# Patient Record
Sex: Male | Born: 1985 | Race: Black or African American | Hispanic: No | Marital: Single | Smoking: Current every day smoker
Health system: Southern US, Community
[De-identification: ages and names within clinical notes are randomized; demographics above are authoritative.]

## PROBLEM LIST (undated history)

## (undated) DIAGNOSIS — J45909 Unspecified asthma, uncomplicated: Secondary | ICD-10-CM

## (undated) DIAGNOSIS — J40 Bronchitis, not specified as acute or chronic: Secondary | ICD-10-CM

---

## 2010-07-20 ENCOUNTER — Emergency Department (HOSPITAL_COMMUNITY): Admission: EM | Admit: 2010-07-20 | Discharge: 2010-07-20 | Payer: Self-pay | Admitting: Emergency Medicine

## 2010-10-31 ENCOUNTER — Emergency Department (HOSPITAL_COMMUNITY)
Admission: EM | Admit: 2010-10-31 | Discharge: 2010-10-31 | Payer: Self-pay | Source: Home / Self Care | Admitting: Emergency Medicine

## 2011-01-10 ENCOUNTER — Emergency Department (HOSPITAL_COMMUNITY)
Admission: EM | Admit: 2011-01-10 | Discharge: 2011-01-10 | Disposition: A | Discharge status: Home / Self Care | Payer: Self-pay | Attending: Emergency Medicine | Admitting: Emergency Medicine

## 2011-01-10 DIAGNOSIS — R51 Headache: Secondary | ICD-10-CM | POA: Insufficient documentation

## 2012-02-05 ENCOUNTER — Emergency Department (HOSPITAL_COMMUNITY): Payer: Self-pay

## 2012-02-05 ENCOUNTER — Encounter (HOSPITAL_COMMUNITY): Payer: Self-pay

## 2012-02-05 ENCOUNTER — Emergency Department (HOSPITAL_COMMUNITY)
Admission: EM | Admit: 2012-02-05 | Discharge: 2012-02-05 | Disposition: A | Discharge status: Home / Self Care | Payer: Self-pay | Attending: Emergency Medicine | Admitting: Emergency Medicine

## 2012-02-05 DIAGNOSIS — F329 Major depressive disorder, single episode, unspecified: Secondary | ICD-10-CM

## 2012-02-05 DIAGNOSIS — F419 Anxiety disorder, unspecified: Secondary | ICD-10-CM

## 2012-02-05 DIAGNOSIS — F341 Dysthymic disorder: Secondary | ICD-10-CM | POA: Insufficient documentation

## 2012-02-05 DIAGNOSIS — R079 Chest pain, unspecified: Secondary | ICD-10-CM | POA: Insufficient documentation

## 2012-02-05 DIAGNOSIS — R0602 Shortness of breath: Secondary | ICD-10-CM | POA: Insufficient documentation

## 2012-02-05 LAB — RAPID URINE DRUG SCREEN, HOSP PERFORMED
Barbiturates: NOT DETECTED
Benzodiazepines: NOT DETECTED

## 2012-02-05 LAB — CBC
HCT: 44.4 % (ref 39.0–52.0)
Hemoglobin: 15.1 g/dL (ref 13.0–17.0)
MCV: 89.3 fL (ref 78.0–100.0)
RDW: 11.7 % (ref 11.5–15.5)
WBC: 6.8 10*3/uL (ref 4.0–10.5)

## 2012-02-05 LAB — BASIC METABOLIC PANEL
BUN: 13 mg/dL (ref 6–23)
CO2: 25 mEq/L (ref 19–32)
Chloride: 104 mEq/L (ref 96–112)
Creatinine, Ser: 0.94 mg/dL (ref 0.50–1.35)
GFR calc Af Amer: >90 mL/min (ref >90)
Glucose, Bld: 100 mg/dL — ABNORMAL HIGH (ref 70–99)
Potassium: 3.7 mEq/L (ref 3.5–5.1)

## 2012-02-05 MED ORDER — LORAZEPAM 1 MG PO TABS
2.0000 mg | ORAL_TABLET | Freq: Once | ORAL | Status: AC
  Administered 2012-02-05: 2 mg via ORAL
  Filled 2012-02-05: qty 2

## 2012-02-05 MED ORDER — ONDANSETRON HCL 4 MG PO TABS: 4.0000 mg | ORAL_TABLET | Freq: Three times a day (TID) | ORAL | Status: DC | PRN

## 2012-02-05 MED ORDER — ASPIRIN 81 MG PO CHEW
324.0000 mg | CHEWABLE_TABLET | Freq: Once | ORAL | Status: AC
  Administered 2012-02-05: 324 mg via ORAL
  Filled 2012-02-05: qty 4

## 2012-02-05 MED ORDER — IBUPROFEN 600 MG PO TABS: 600.0000 mg | ORAL_TABLET | Freq: Three times a day (TID) | ORAL | Status: DC | PRN

## 2012-02-05 MED ORDER — ALUM & MAG HYDROXIDE-SIMETH 200-200-20 MG/5ML PO SUSP: 30.0000 mL | ORAL | Status: DC | PRN

## 2012-02-05 MED ORDER — NICOTINE 21 MG/24HR TD PT24: 21.0000 mg | MEDICATED_PATCH | Freq: Every day | TRANSDERMAL | Status: DC

## 2012-02-05 MED ORDER — ZOLPIDEM TARTRATE 5 MG PO TABS: 5.0000 mg | ORAL_TABLET | Freq: Every evening | ORAL | Status: DC | PRN

## 2012-02-05 MED ORDER — ACETAMINOPHEN 325 MG PO TABS: 650.0000 mg | ORAL_TABLET | ORAL | Status: DC | PRN

## 2012-02-05 NOTE — ED Notes (Signed)
Pt c/o chest pain 8, explained to him due to anxiety and to follow up with [facility] asap to be prescribed medications for anxiety.

## 2012-02-05 NOTE — ED Notes (Signed)
Spoke to MD Jeraldine Loots, pt will not be given additional medicine prior to discharging home as he is medically stable. Pt told again to follow up with [facility] to get anxiety and depression medications prescribed.

## 2012-02-05 NOTE — Discharge Instructions (Signed)
Anxiety and Panic Attacks   Your caregiver has informed you that you are having an anxiety or panic attack. There may be many forms of this. Most of the time these attacks come suddenly and without warning. They come at any time of day, including periods of sleep, and at any time of life. They may be strong and unexplained. Although panic attacks are very scary, they are physically harmless. Sometimes the cause of your anxiety is not known. Anxiety is a protective mechanism of the body in its fight or flight mechanism. Most of these perceived danger situations are actually nonphysical situations (such as anxiety over losing a job).   CAUSES   The causes of an anxiety or panic attack are many. Panic attacks may occur in otherwise healthy people given a certain set of circumstances. There may be a genetic cause for panic attacks. Some medications may also have anxiety as a side effect.   SYMPTOMS   Some of the most common feelings are:   Intense terror.   Dizziness, feeling faint.   Hot and cold flashes.   Fear of going crazy.   Feelings that nothing is real.   Sweating.   Shaking.   Chest pain or a fast heartbeat (palpitations).   Smothering, choking sensations.   Feelings of impending doom and that death is near.   Tingling of extremities, this may be from over-breathing.   Altered reality (derealization).   Being detached from yourself (depersonalization).   Several symptoms can be present to make up anxiety or panic attacks.   DIAGNOSIS   The evaluation by your caregiver will depend on the type of symptoms you are experiencing. The diagnosis of anxiety or panic attack is made when no physical illness can be determined to be a cause of the symptoms.   TREATMENT   Treatment to prevent anxiety and panic attacks may include:   Avoidance of circumstances that cause anxiety.   Reassurance and relaxation.   Regular exercise.   Relaxation therapies, such as yoga.   Psychotherapy with a psychiatrist or therapist.    Avoidance of caffeine, alcohol and illegal drugs.   Prescribed medication.   SEEK IMMEDIATE MEDICAL CARE IF:   You experience panic attack symptoms that are different than your usual symptoms.   You have any worsening or concerning symptoms.   Document Released: 09/21/2005 Document Revised: 09/10/2011 Document Reviewed: 01/23/2010   ExitCare® Patient Information ©2012 [employer].   Depression, Adolescent and Adult   Depression is a true and treatable medical condition. In general there are two kinds of depression:   Depression we all experience in some form. For example depression from the death of a loved one, financial distress or natural disasters will trigger or increase depression.   Clinical depression, on the other hand, appears without an apparent cause or reason. This depression is a disease. Depression may be caused by chemical imbalance in the body and brain or may come as a response to a physical illness. Alcohol and other drugs can cause depression.   DIAGNOSIS   The diagnosis of depression is usually based upon symptoms and medical history.   TREATMENT   Treatments for depression fall into three categories. These are:   Drug therapy. There are many medicines that treat depression. Responses may vary and sometimes trial and error is necessary to determine the best medicines and dosage for a particular patient.   Psychotherapy, also called talking treatments, helps people resolve their problems by looking at them from   move toward solving those. If the cause of depression is drug use, counseling is available to help abstain. In time the depression will usually improve. If there were underlying causes for the chemical use, they can be addressed.   ECT  (electroconvulsive therapy) or shock treatment is not as commonly used today. It is a very effective treatment for severe suicidal depression. During ECT electrical impulses are applied to the head. These impulses cause a generalized seizure. It can be effective but causes a loss of memory for recent events. Sometimes this loss of memory may include the last several months.  Treat all depression or suicide threats as serious. Obtain professional help. Do not wait to see if serious depression will get better over time without help. Seek help for yourself or those around you. In the [place] the number to the National Suicide Help Lines With 24 Hour Help Are: 1-800-SUICIDE (780) 613-7142 Document Released: 09/18/2000 Document Revised: 09/10/2011 Document Reviewed: 05/09/2008 [facility] Patient Information 2012 [place], [place].Emotional Crisis Part of your problem today may be due to an emotional crisis. Emotional states can cause many different physical signs and symptoms. These may include:  Chest or stomach pain.   Fluttering heartbeat.   Passing out.   Breathing difficulty.   Headaches.   Trembling.   Hot or cold flashes.   Numbness.   Dizziness.   Unusual muscle pain or fatigue.   Insomnia.  When you have other medical problems, they are often made worse by emotional upsets. Emotional crises can increase your stress and anxiety. Finding ways to reduce your stress level can make you feel better. You will become more capable of dealing with these emotional states. Regular physical exercise such as walking can be very beneficial. Counseling or medicine to treat anxiety or depression may also be needed. See your caregiver if you have further problems or questions about your condition. Document Released: 09/21/2005 Document Revised: 09/10/2011 Document Reviewed: 03/08/2007 [facility] Patient Information 2012 [place], [place].Suicidal Feelings, How to Help Yourself Everyone feels sad or unhappy  at times, but depressing thoughts and feelings of hopelessness can lead to thoughts of suicide. It can seem as if life is too tough to handle. It is as if the mountain is just too high and your climbing skills are not great enough. At that moment these dark thoughts and feelings may seem overwhelming and never ending. It is important to remember these feelings are temporary! They will go away. If you feel as though you have reached the point where suicide is the only answer, it is time to let someone know immediately. This is the first step to feeling better. The following steps will move you to safer ground and lead you in a positive direction out of depression. HOW TO COPE AND PREVENT SUICIDE  Let family, friends, teachers and/or counselors know. Get help. Try not to isolate yourself from those who care about you. Even though you may not feel sociable or think that you are not good company, talk with someone everyday. It is best if it is face to face. Remember, they will want to help you.   Eat a regularly spaced and well-balanced diet, and get plenty of rest.   Avoid alcohol and drugs because they will only make you feel worse and may also lower your inhibitions. Remove them from the home. If you are thinking of taking an overdose of your prescribed medications, give your medicines to someone who can give them to you one day at a time. If  you are on antidepressants, let your caregiver know of your feelings so he or she can provide a safer medication, if that is a concern.   Remove weapons or poisons from your home.   Try to stick to routines. That may mean just walking the dog or feeding the cat. Follow a schedule and remind yourself that you have to keep that schedule every day. Play with your pets. If it is possible, and you do not have a pet, get one. They give you a sense of well-being, lower your blood pressure and make your heart feel good. They need you, and we all want to be needed.   Set  some realistic goals and achieve them. Make a list and cross things off as you go. Accomplishments give a sense of worth. Wait until you are feeling better before doing things you find difficult or unpleasant to do.   If you are able, try to start exercising. Even half-hour periods of exercise each day will make you feel better. Getting out in the sun or into nature helps you recover from depression faster. If you have a favorite place to walk, take advantage of that.   Increase safe activities that have always given you pleasure. This may include playing your favorite music, reading a good book, painting a picture or playing your favorite instrument. Do whatever takes your mind off your depression and puts a smile on your face.   Keep your living space well lit with windows open, and let the sun shine in. Bright light definitely treats depression, not just people with the seasonal affective disorders (SAD).  Above all else remember, depression is temporary. It will go away. Do not contemplate suicide. Death as a permanent solution is not the answer. Suicide will take away the beautiful rest of life, and do lifelong harm to those around you who love you. Help is available. National Suicide Help Lines with 24 hour help are: 1-800-SUICIDE 805-763-3653 Document Released: 03/28/2003 Document Revised: 09/10/2011 Document Reviewed: 08/16/2007 [facility] Patient Information 2012 [place], [place].  RESOURCE GUIDE  Dental Problems  Patients with Medicaid: [facility] [phone] [address]                                           [phone] [address] Phone:  [phone]                                                  Phone:  [phone]  If unable to pay or uninsured, contact:  [facility] or [facility]. to become qualified for the adult dental clinic.  Chronic Pain Problems Contact [facility]  [phone] Patients need  to be referred by their primary care doctor.  Insufficient Money for Medicine Contact United Way:  call "211" or [facility] [phone].  No Primary Care Doctor Call [facility]  [phone] Other agencies that provide inexpensive medical care    [facility]  [phone]    [facility] Internal Medicine  [phone]    [facility]  [phone]  Women's Clinic  [phone]    [facility]  [phone]    [facility]  [phone]  Psychological Services [facility]  [phone] [facility]  [phone] [facility] Mental [facility]   [phone] (emergency services [phone])  Substance Abuse Resources [facility]  [phone] [facility] [phone] [facility] [phone] Floydene Flock [phone] Residential & Outpatient Substance Abuse Program  9062801011  Abuse/Neglect [facility] Child Abuse Hotline 780-348-9632 [facility] Child Abuse Hotline [phone] (After Hours)  Emergency Shelter [facility] [phone]  [facility] [phone] [facility] [phone]  MRSA Hotline #:   417-100-6821    [facility] Resources  [facility] [place]     [employer]                          [facility] Dept. [address] [place]                       [address]      [address]  Blondell Reveal Phone:  [phone]                                   Phone:  [phone]                 Phone:  [phone]  [facility] Mental Health Phone:  [phone]  [facility] Child Abuse Hotline 434-562-1550 [phone] (After Hours)

## 2012-02-05 NOTE — BH Assessment (Signed)
Assessment Note   Eric Stout is an 26 y.o. male. Pt presented to [facility] with chest pain and shortness of breath. Upon discussion, pt disclosed stress and anxiety related to issues going on in his life. Pt stated he has some vague SI at times, with none currently. Reports having a mother very sick from HIV, relationship that ended suddenly, which he is grieving over and some work issues. Pt denies prior history of SI attempts or gestures. Pt denies HI or psychosis. Pt reports having dreams that his daughter is there, but when he awakens she is gone. Pt has increased anxiety/depressive symptoms as follows: poor appetite, decrease in sleep, tearful episodes and recently started having panic attacks. First panic attack was two weeks ago with the second occurring today. Pt stated to ACT "I'm not crazy. I don't need to be here (in psych ED). I just need someone to talk to." Discussed possible EAP and OPT with pt. Denies prior OPT history and only one IPT over 15 years ago for SA. Pt is agreeable with OPT referrals. Discussed with EDP. Pt was provided with OPT referrals to [facility] OPT and other community OPT resources.  Axis I: Anxiety Disorder NOS Axis II: Deferred Axis III: History reviewed. No pertinent past medical history. Axis IV: other psychosocial or environmental problems, problems related to social environment and problems with primary support group Axis V: 41-50 serious symptoms  Past Medical History: History reviewed. No pertinent past medical history.  History reviewed. No pertinent past surgical history.  Family History: History reviewed. No pertinent family history.  Social History:  reports that he has been smoking.  He does not have any smokeless tobacco history on file. He reports that he drinks alcohol. He reports that he uses illicit drugs (Marijuana).  Additional Social History:  Alcohol / Drug Use Pain Medications: N/A Prescriptions: N/A Over the Counter: N/A History of alcohol /  drug use?: Yes Substance #1 Name of Substance 1: THC 1 - Age of First Use: teens 1 - Amount (size/oz): varies 1 - Frequency: varies 1 - Duration: off/on years 1 - Last Use / Amount: Unknown Allergies:  Allergies  Allergen Reactions  . Penicillins Other (See Comments)    Doesn't remember.    Home Medications:  (Not in a hospital admission)  OB/GYN Status:  No LMP for male patient.  General Assessment Data Location of Assessment: [facility] ED Living Arrangements: Other relatives Can pt return to current living arrangement?: Yes Admission Status: Voluntary Is patient capable of signing voluntary admission?: Yes Transfer from: Acute Hospital Referral Source: Self/Family/Friend  Education Status Is patient currently in school?: No  Risk to self Suicidal Ideation: No-Not Currently/Within Last 6 Months (Admits to having SI thoughts sometimes) Suicidal Intent: No Is patient at risk for suicide?: No Suicidal Plan?: No Access to Means: No What has been your use of drugs/alcohol within the last 12 months?: Occasional THC Previous Attempts/Gestures: No How many times?: 0  Other Self Harm Risks: N/A Triggers for Past Attempts: None known Intentional Self Injurious Behavior: None Family Suicide History: Yes (MH on mother's side) Recent stressful life event(s): Conflict (Comment);Other (Comment) (Mother is sick w/ HIV; Relationship breakup; work issues) Persecutory voices/beliefs?: No Depression: Yes Depression Symptoms: Despondent;Insomnia;Tearfulness Substance abuse history and/or treatment for substance abuse?: Yes Suicide prevention information given to non-admitted patients: Yes  Risk to Others Homicidal Ideation: No Thoughts of Harm to Others: No Current Homicidal Intent: No Current Homicidal Plan: No Access to Homicidal Means: No Identified Victim: n/a History  of harm to others?: No Assessment of Violence: None Noted Violent Behavior Description: calm, cooperative,  tearful Does patient have access to weapons?: No Criminal Charges Pending?: No Does patient have a court date: No  Psychosis Hallucinations: None noted Delusions: None noted  Mental Status Report Appear/Hygiene: Other (Comment) (unremarkable) Eye Contact: Good Motor Activity: Freedom of movement Speech: Logical/coherent Level of Consciousness: Alert;Crying Mood: Anxious;Sad Affect: Appropriate to circumstance;Anxious Anxiety Level: Panic Attacks Panic attack frequency: had first 2 wks ago; then again today Most recent panic attack: 02/05/12 Thought Processes: Coherent;Relevant Judgement: Unimpaired Orientation: Person;Place;Time;Situation Obsessive Compulsive Thoughts/Behaviors: None  Cognitive Functioning Concentration: Decreased Memory: Recent Intact;Remote Intact IQ: Average Insight: Good Impulse Control: Good Appetite: Poor Weight Loss: 0  Weight Gain: 0  Sleep: Decreased Total Hours of Sleep: 3  Vegetative Symptoms: None  Prior Inpatient Therapy Prior Inpatient Therapy: Yes Prior Therapy Dates: 15+ yrs ago Prior Therapy Facilty/Provider(s): Unknown Reason for Treatment: SA  Prior Outpatient Therapy Prior Outpatient Therapy: No  ADL Screening (condition at time of admission) Patient's cognitive ability adequate to safely complete daily activities?: Yes Patient able to express need for assistance with ADLs?: Yes Independently performs ADLs?: Yes Weakness of Legs: None Weakness of Arms/Hands: None  Home Assistive Devices/Equipment Home Assistive Devices/Equipment: None    Abuse/Neglect Assessment (Assessment to be complete while patient is alone) Physical Abuse: Denies Verbal Abuse: Denies Sexual Abuse: Denies Exploitation of patient/patient's resources: Denies Self-Neglect: Denies     Merchant navy officer (For Healthcare) Advance Directive: Patient does not have advance directive;Patient would not like information Pre-existing out of facility DNR  order (yellow form or pink MOST form): No    Additional Information 1:1 In Past 12 Months?: No CIRT Risk: No Elopement Risk: No Does patient have medical clearance?: Yes     Disposition:  Disposition Disposition of Patient: Outpatient treatment;Referred to [facility] OPT; OPT referrals) Type of outpatient treatment: Adult Patient referred to: Other (Comment) [facility] OPT; OPT referrals)  On Site Evaluation by:   Reviewed with Physician:     Romeo Apple 02/05/2012 3:40 PM

## 2012-02-05 NOTE — ED Notes (Signed)
Pt walking w/securtiy to Psych ED.  Security carrying 2 labeled belongings bags to be locked in lockers in psych ed area.

## 2012-02-05 NOTE — ED Notes (Signed)
Pt has fallen asleep.  Appears to be relaxed.  When he awakes, startes to hyperventilate and c/o the chest "heart" pain.

## 2012-02-05 NOTE — ED Provider Notes (Signed)
History     CSN: [id number]  Arrival date & time 02/05/12  1116   First MD Initiated Contact with Patient 02/05/12 1128      Chief Complaint  Patient presents with  . Chest Pain  . Shortness of Breath  . Medical Clearance    (Consider location/radiation/quality/duration/timing/severity/associated sxs/prior treatment) HPI  Pt presents to the ED with concerns of chest pressure, shortness of breath, anxiety, stress, hopelessness. He states that his mom is very sick with HIV and that he has a lot of things going on at home, with his relationships with work. The patient is moaning and very tearful during examination. Clutching his chest and saying that he hurts all over and that he needs to talk to someone to get help because of his depression. The patient does not have a history of cardiac disease, hypertension, or hypercholesterolemia. He does admit to the use of cocaine in the past but not within the last month. He does not have a family history of cardiac dz. He has not tried anything at home for the pain. He has not had any vomiting, diarrhea, or abdominal pain. No past psychiatric history.  History reviewed. No pertinent past medical history.  History reviewed. No pertinent past surgical history.  History reviewed. No pertinent family history.  History  Substance Use Topics  . Smoking status: Current Everyday Smoker -- 1.0 packs/day  . Smokeless tobacco: Not on file  . Alcohol Use: Yes     once in a while.      Review of Systems   HEENT: denies blurry vision or change in hearing PULMONARY: Denies difficulty breathing and SOB CARDIAC: denies chest pain or heart palpitations MUSCULOSKELETAL:  denies being unable to ambulate ABDOMEN AL: denies abdominal pain GU: denies loss of bowel or urinary control NEURO: denies numbness and tingling in extremities   Allergies  Penicillins  Home Medications  No current outpatient prescriptions on file.  BP 126/73  Pulse 68   Temp(Src) 98.2 F (36.8 C) (Oral)  Resp 18  SpO2 98%  Physical Exam  Nursing note and vitals reviewed. Constitutional: He appears well-developed and well-nourished. No distress.  HENT:  Head: Normocephalic and atraumatic.  Eyes: Pupils are equal, round, and reactive to light.  Neck: Normal range of motion. Neck supple.  Cardiovascular: Normal rate and regular rhythm.   Pulmonary/Chest: Breath sounds normal. Accessory muscle usage: on initial examination. Tachypnea noted.  Abdominal: Soft.  Neurological: He is alert.  Skin: Skin is warm and dry.  Psychiatric: His mood appears anxious. He exhibits a depressed mood.    ED Course  Procedures (including critical care time)  Labs Reviewed  URINE RAPID DRUG SCREEN (HOSP PERFORMED) - Abnormal; Notable for the following:    Tetrahydrocannabinol POSITIVE (*)    All other components within normal limits  BASIC METABOLIC PANEL - Abnormal; Notable for the following:    Glucose, Bld 100 (*)    All other components within normal limits  CBC  TROPONIN I  ETHANOL   Dg Chest 2 View  02/05/2012  *RADIOLOGY REPORT*  Clinical Data: Chest pain.  Anxiety.  CHEST - 2 VIEW  Comparison: None  Findings: Heart and mediastinal contours are within normal limits. No focal opacities or effusions.  No acute bony abnormality.  IMPRESSION: No active cardiopulmonary disease.  Original Report Authenticated By: Cyndie Chime, M.D.     1. Anxiety   2. Depression       MDM  Pt given 2mg  PO ativan. Since  receiving the medication he has been chest pain and SOB free. His vital signs are stable, no longer tachypnic. He is no longer very anxious and is now resting comfortably. He admits to wanting to talk to someone for his anxiety and continued feelings of hopelessness.  Will consult ACT.  Pt evaluated by ACT and given resources for help with his life stressors. Pt is cleared to go home at this time.   Pt has been advised of the symptoms that warrant their  return to the ED. Patient has voiced understanding and has agreed to follow-up with the PCP or specialist.       Dorthula Matas, PA 02/05/12 1530

## 2012-02-05 NOTE — ED Notes (Signed)
Pt c/o pain to chest starting last night-stabbing and aching.  Also shob.  Pt is hyperventilating at present.  Reassurance given.  On heart monitor showing NSR.   Pt has now fallen asleep and is breathing normally.  When previously asked, pt denied any drug use.

## 2012-02-05 NOTE — ED Notes (Signed)
Pt is discharging home with referrals.

## 2012-02-06 NOTE — ED Provider Notes (Signed)
Medical screening examination/treatment/procedure(s) were performed by non-physician practitioner and as supervising physician I was immediately available for consultation/collaboration.  Kaedin Hicklin, MD 02/06/12 1530 

## 2014-10-15 ENCOUNTER — Encounter (HOSPITAL_COMMUNITY): Payer: Self-pay | Admitting: Emergency Medicine

## 2014-10-15 ENCOUNTER — Emergency Department (HOSPITAL_COMMUNITY)
Admission: EM | Admit: 2014-10-15 | Discharge: 2014-10-15 | Disposition: A | Discharge status: Home / Self Care | Payer: Self-pay | Attending: Emergency Medicine | Admitting: Emergency Medicine

## 2014-10-15 DIAGNOSIS — M546 Pain in thoracic spine: Secondary | ICD-10-CM | POA: Insufficient documentation

## 2014-10-15 DIAGNOSIS — Z720 Tobacco use: Secondary | ICD-10-CM | POA: Insufficient documentation

## 2014-10-15 DIAGNOSIS — Z880 Allergy status to penicillin: Secondary | ICD-10-CM | POA: Insufficient documentation

## 2014-10-15 DIAGNOSIS — J45909 Unspecified asthma, uncomplicated: Secondary | ICD-10-CM | POA: Insufficient documentation

## 2014-10-15 HISTORY — DX: Unspecified asthma, uncomplicated: J45.909

## 2014-10-15 MED ORDER — METHOCARBAMOL 500 MG PO TABS: 500.0000 mg | ORAL_TABLET | Freq: Two times a day (BID) | ORAL | Status: DC

## 2014-10-15 MED ORDER — NAPROXEN 500 MG PO TABS: 500.0000 mg | ORAL_TABLET | Freq: Two times a day (BID) | ORAL | Status: DC

## 2014-10-15 MED ORDER — METHOCARBAMOL 500 MG PO TABS
500.0000 mg | ORAL_TABLET | Freq: Once | ORAL | Status: AC
  Administered 2014-10-15: 500 mg via ORAL
  Filled 2014-10-15: qty 1

## 2014-10-15 MED ORDER — TRAMADOL HCL 50 MG PO TABS
50.0000 mg | ORAL_TABLET | Freq: Once | ORAL | Status: AC
  Administered 2014-10-15: 50 mg via ORAL
  Filled 2014-10-15: qty 1

## 2014-10-15 MED ORDER — TRAMADOL HCL 50 MG PO TABS: 50.0000 mg | ORAL_TABLET | Freq: Four times a day (QID) | ORAL | Status: DC | PRN
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
## 2015-02-26 ENCOUNTER — Emergency Department (HOSPITAL_COMMUNITY)
Admission: EM | Admit: 2015-02-26 | Discharge: 2015-02-27 | Disposition: A | Discharge status: Home / Self Care | Payer: Self-pay | Attending: Emergency Medicine | Admitting: Emergency Medicine

## 2015-02-26 ENCOUNTER — Emergency Department (HOSPITAL_COMMUNITY): Payer: Self-pay

## 2015-02-26 ENCOUNTER — Encounter (HOSPITAL_COMMUNITY): Payer: Self-pay | Admitting: Emergency Medicine

## 2015-02-26 DIAGNOSIS — Z880 Allergy status to penicillin: Secondary | ICD-10-CM | POA: Insufficient documentation

## 2015-02-26 DIAGNOSIS — Z791 Long term (current) use of non-steroidal anti-inflammatories (NSAID): Secondary | ICD-10-CM | POA: Insufficient documentation

## 2015-02-26 DIAGNOSIS — Z720 Tobacco use: Secondary | ICD-10-CM | POA: Insufficient documentation

## 2015-02-26 DIAGNOSIS — Z79899 Other long term (current) drug therapy: Secondary | ICD-10-CM | POA: Insufficient documentation

## 2015-02-26 DIAGNOSIS — J45901 Unspecified asthma with (acute) exacerbation: Secondary | ICD-10-CM | POA: Insufficient documentation

## 2015-02-26 DIAGNOSIS — J40 Bronchitis, not specified as acute or chronic: Secondary | ICD-10-CM

## 2015-02-26 NOTE — ED Notes (Addendum)
Patient c/o burning to chest with cough, as well as sinus congestion. Cough x 2 days, light yellow phlegm. Patient states his neck is also sore. Patient has been increasing his fluid intake to help with his symptoms. Fever/chills.

## 2015-02-27 MED ORDER — PHENYLEPHRINE-DM-GG-APAP 5-10-200-325 MG PO TABS: 2.0000 | ORAL_TABLET | Freq: Three times a day (TID) | ORAL | Status: AC

## 2015-02-27 MED ORDER — NAPROXEN 500 MG PO TABS: 500.0000 mg | ORAL_TABLET | Freq: Two times a day (BID) | ORAL | Status: DC

## 2015-02-27 MED ORDER — ALBUTEROL SULFATE HFA 108 (90 BASE) MCG/ACT IN AERS
2.0000 | INHALATION_SPRAY | RESPIRATORY_TRACT | Status: AC
  Administered 2015-02-27: 2 via RESPIRATORY_TRACT
  Filled 2015-02-27: qty 6.7

## 2015-02-27 MED ORDER — ALBUTEROL SULFATE (2.5 MG/3ML) 0.083% IN NEBU
5.0000 mg | INHALATION_SOLUTION | Freq: Once | RESPIRATORY_TRACT | Status: AC
  Administered 2015-02-27: 5 mg via RESPIRATORY_TRACT
  Filled 2015-02-27: qty 6

## 2015-02-27 MED ORDER — NAPROXEN 500 MG PO TABS
500.0000 mg | ORAL_TABLET | Freq: Once | ORAL | Status: AC
  Administered 2015-02-27: 500 mg via ORAL
  Filled 2015-02-27: qty 1

## 2015-02-27 MED ORDER — IPRATROPIUM BROMIDE 0.02 % IN SOLN
0.5000 mg | Freq: Once | RESPIRATORY_TRACT | Status: AC
  Administered 2015-02-27: 0.5 mg via RESPIRATORY_TRACT
  Filled 2015-02-27: qty 2.5

## 2015-02-27 NOTE — Discharge Instructions (Signed)
Please follow the directions provided. Be sure to use the resource guide or the referral given to establish care with a primary care doctor to make sure you're getting better. Please use the inhaler 2 puffs every 4 hours to help with cough and shortness of breath. Please take naproxen twice a day to help with pain and inflammation. Please take the multisymptom cold medicine 3 times a day for other cold symptoms. Be sure to drink plenty of water to stay well hydrated. Don't hesitate to return for any new, worsening, or concerning symptoms.   SEEK IMMEDIATE MEDICAL CARE IF:  You develop an increased fever or chills.  You have chest pain.  You have severe shortness of breath.  You have bloody sputum.  You develop dehydration.  You faint or repeatedly feel like you are going to pass out.  You develop repeated vomiting.  You develop a severe headache.

## 2015-02-27 NOTE — ED Provider Notes (Signed)
CSN: [id number]     Arrival date & time 02/26/15  2240 History   First MD Initiated Contact with Patient 02/27/15 0101     Chief Complaint  Patient presents with  . Cough   (Consider location/radiation/quality/duration/timing/severity/associated sxs/prior Treatment) HPI  Eric Stout is a 29 year old male presenting with cough and nasal congestion. He states he symptoms began 2 days ago" progressively worsened. He states when he coughs it burns in his chest, he also coughs up yellow mucus. He reports muscle aches and subjective fever and chills. He initially reports pain in his neck however during the interview this clarified to mean his bilateral shoulder muscles and he denies any neck stiffness. He currently rates his discomfort as an 8 out of 10. He denies any hemoptysis, vomiting or diarrhea or chest pain without coughing.  Past Medical History  Diagnosis Date  . Asthma    History reviewed. No pertinent past surgical history. History reviewed. No pertinent family history. History  Substance Use Topics  . Smoking status: Current Every Day Smoker -- 0.25 packs/day  . Smokeless tobacco: Not on file  . Alcohol Use: No     Comment: once in a while.    Review of Systems  Constitutional: Negative for fever and chills.  HENT: Positive for congestion. Negative for sore throat.   Eyes: Negative for visual disturbance.  Respiratory: Positive for cough and chest tightness. Negative for shortness of breath.   Gastrointestinal: Negative for nausea, vomiting and diarrhea.  Genitourinary: Negative for dysuria.  Musculoskeletal: Negative for myalgias.  Skin: Negative for rash.  Neurological: Negative for weakness, numbness and headaches.      Allergies  Peanuts and Penicillins  Home Medications   Prior to Admission medications   Medication Sig Start Date End Date Taking? Authorizing Provider  methocarbamol (ROBAXIN) 500 MG tablet Take 1 tablet (500 mg total) by mouth 2 (two) times  daily. 10/15/14   Heather Laisure, PA-C  naproxen (NAPROSYN) 500 MG tablet Take 1 tablet (500 mg total) by mouth 2 (two) times daily. 10/15/14   Heather Laisure, PA-C  traMADol (ULTRAM) 50 MG tablet Take 1 tablet (50 mg total) by mouth every 6 (six) hours as needed. 10/15/14   Heather Laisure, PA-C   BP 131/75 mmHg  Pulse 105  Temp(Src) 98.3 F (36.8 C) (Oral)  Resp 18  Ht  (1.778 m)  Wt 170 lb (77.111 kg)  BMI 24.39 kg/m2  SpO2 97% Physical Exam  Constitutional: He appears well-developed and well-nourished. No distress.  HENT:  Head: Normocephalic and atraumatic.  Mouth/Throat: Oropharynx is clear and moist.  Eyes: Conjunctivae are normal.  Neck: Neck supple.  Cardiovascular: Normal rate, regular rhythm and intact distal pulses.   Pulmonary/Chest: Effort normal. No respiratory distress. He has wheezes in the right middle field and the left middle field. He has no rhonchi. He has no rales. He exhibits no tenderness.  Abdominal: Soft. There is no tenderness.  Lymphadenopathy:    He has no cervical adenopathy.  Neurological: He is alert.  Skin: Skin is warm and dry. He is not diaphoretic.  Psychiatric: He has a normal mood and affect.  Nursing note and vitals reviewed.   ED Course  Procedures (including critical care time) Labs Review Labs Reviewed - No data to display  Imaging Review Dg Chest 2 View  02/26/2015   CLINICAL DATA:  Cough and chest congestion for 1 week  EXAM: CHEST  2 VIEW  COMPARISON:  02/05/2012  FINDINGS: Normal heart size and  mediastinal contours. No acute infiltrate or edema. No effusion or pneumothorax. No acute osseous findings.  IMPRESSION: Negative chest.   Electronically Signed   By: Marnee SpringJonathon  Watts M.D.   On: 02/26/2015 23:12     EKG Interpretation None      MDM   Final diagnoses:  Bronchitis   29 yo with symptoms consistent with bronchitis. CXR negative. Symptoms improved after neb tx. Pt will be discharged with symptomatic treatment. Pt  is well-appearing, in no acute distress and vital signs reviewed and not concerning. He appears safe to be discharged. Discharge include follow-up with their PCP. Return precautions provided. Pt aware of plan and in agreement.     Filed Vitals:   02/26/15 2247 02/27/15 0220  BP: 131/75 123/75  Pulse: 105 99  Temp: 98.3 F (36.8 C)   TempSrc: Oral   Resp: 18 14  Height: 5\' 10"  (1.778 m)   Weight: 170 lb (77.111 kg)   SpO2: 97% 100%   Meds given in ED:  Medications  albuterol (PROVENTIL) (2.5 MG/3ML) 0.083% nebulizer solution 5 mg (5 mg Nebulization Given 02/27/15 0219)  ipratropium (ATROVENT) nebulizer solution 0.5 mg (0.5 mg Nebulization Given 02/27/15 0220)  naproxen (NAPROSYN) tablet 500 mg (500 mg Oral Given 02/27/15 0220)  albuterol (PROVENTIL HFA;VENTOLIN HFA) 108 (90 BASE) MCG/ACT inhaler 2 puff (2 puffs Inhalation Given 02/27/15 0219)    Discharge Medication List as of 02/27/2015  1:55 AM    START taking these medications   Details  !! naproxen (NAPROSYN) 500 MG tablet Take 1 tablet (500 mg total) by mouth 2 (two) times daily., Starting 02/27/2015, Until Discontinued, Print    Phenylephrine-DM-GG-APAP 5-10-200-325 MG TABS Take 2 tablets by mouth 3 (three) times daily., Starting 02/27/2015, Until Discontinued, Print     !! - Potential duplicate medications found. Please discuss with provider.        Harle BattiestElizabeth Sonny Anthes, NP 03/01/15 [phone]  Tomasita CrumbleAdeleke Oni, MD 03/01/15 [phone]

## 2015-05-23 ENCOUNTER — Encounter (HOSPITAL_COMMUNITY): Payer: Self-pay | Admitting: Cardiology

## 2015-05-23 ENCOUNTER — Emergency Department (HOSPITAL_COMMUNITY)
Admission: EM | Admit: 2015-05-23 | Discharge: 2015-05-23 | Disposition: A | Discharge status: Home / Self Care | Payer: Self-pay | Attending: Emergency Medicine | Admitting: Emergency Medicine

## 2015-05-23 ENCOUNTER — Emergency Department (HOSPITAL_COMMUNITY): Payer: Self-pay

## 2015-05-23 DIAGNOSIS — R0789 Other chest pain: Secondary | ICD-10-CM | POA: Insufficient documentation

## 2015-05-23 DIAGNOSIS — Z880 Allergy status to penicillin: Secondary | ICD-10-CM | POA: Insufficient documentation

## 2015-05-23 DIAGNOSIS — Z79899 Other long term (current) drug therapy: Secondary | ICD-10-CM | POA: Insufficient documentation

## 2015-05-23 DIAGNOSIS — Z791 Long term (current) use of non-steroidal anti-inflammatories (NSAID): Secondary | ICD-10-CM | POA: Insufficient documentation

## 2015-05-23 DIAGNOSIS — Z720 Tobacco use: Secondary | ICD-10-CM | POA: Insufficient documentation

## 2015-05-23 DIAGNOSIS — J45909 Unspecified asthma, uncomplicated: Secondary | ICD-10-CM | POA: Insufficient documentation

## 2015-05-23 LAB — BASIC METABOLIC PANEL
ANION GAP: 7 (ref 5–15)
BUN: 8 mg/dL (ref 6–20)
CO2: 27 mmol/L (ref 22–32)
Calcium: 9.5 mg/dL (ref 8.9–10.3)
Chloride: 106 mmol/L (ref 101–111)
Creatinine, Ser: 1.14 mg/dL (ref 0.61–1.24)
GFR calc Af Amer: >60 mL/min (ref >60)
Glucose, Bld: 88 mg/dL (ref 65–99)
POTASSIUM: 4.1 mmol/L (ref 3.5–5.1)
SODIUM: 140 mmol/L (ref 135–145)

## 2015-05-23 LAB — CBC
HEMATOCRIT: 47.6 % (ref 39.0–52.0)
HEMOGLOBIN: 16.3 g/dL (ref 13.0–17.0)
MCH: 31 pg (ref 26.0–34.0)
MCHC: 34.2 g/dL (ref 30.0–36.0)
MCV: 90.7 fL (ref 78.0–100.0)
Platelets: 235 10*3/uL (ref 150–400)
RBC: 5.25 MIL/uL (ref 4.22–5.81)
RDW: 11.8 % (ref 11.5–15.5)
WBC: 9.6 10*3/uL (ref 4.0–10.5)

## 2015-05-23 LAB — I-STAT TROPONIN, ED: Troponin i, poc: 0 ng/mL (ref 0.00–0.08)

## 2015-05-23 MED ORDER — ALBUTEROL SULFATE (2.5 MG/3ML) 0.083% IN NEBU
5.0000 mg | INHALATION_SOLUTION | Freq: Once | RESPIRATORY_TRACT | Status: AC
  Administered 2015-05-23: 5 mg via RESPIRATORY_TRACT
  Filled 2015-05-23: qty 6

## 2015-05-23 MED ORDER — PREDNISONE 20 MG PO TABS
60.0000 mg | ORAL_TABLET | Freq: Once | ORAL | Status: AC
  Administered 2015-05-23: 60 mg via ORAL
  Filled 2015-05-23: qty 3

## 2015-05-23 MED ORDER — PREDNISONE 20 MG PO TABS: 40.0000 mg | ORAL_TABLET | Freq: Every day | ORAL | Status: DC

## 2015-05-23 NOTE — ED Notes (Signed)
Pt reports that he started having chest pain this morning that hurts with deep breaths. States he has not had nausea or vomiting. Also reports some back pain also.

## 2015-05-23 NOTE — Discharge Instructions (Signed)
Take the prescribed medication as directed.  Use your inhaler as needed for chest tightness/shortness of breath/wheezing. [facility]up with [facility]-- call to make appt. Return to the ED for new or worsening symptoms.

## 2015-05-23 NOTE — ED Provider Notes (Signed)
CSN: [id number]     Arrival date & time 05/23/15  4098 History   First MD Initiated Contact with Patient 05/23/15 1112     Chief Complaint  Patient presents with  . Chest Pain     (Consider location/radiation/quality/duration/timing/severity/associated sxs/prior Treatment) Patient is a 29 y.o. male presenting with chest pain. The history is provided by the patient and medical records.  Chest Pain   This is a 29 year old male with history of asthma, presenting to the ED for chest pain. Patient states it began this morning around 0500.  Pain is described as sharp in the middle of his chest. No radiation of pain. He denies shortness of breath, states he does have some pain with deep inspiration. He denies any diaphoresis, nausea, vomiting, fever, cough, or chills. He denies any recent travel, lower extremity edema, calf pain, recent surgery, or trauma. No history of DVT or PE.  VSS.  Past Medical History  Diagnosis Date  . Asthma    History reviewed. No pertinent past surgical history. History reviewed. No pertinent family history. Social History  Substance Use Topics  . Smoking status: Current Every Day Smoker -- 0.25 packs/day  . Smokeless tobacco: None  . Alcohol Use: No     Comment: once in a while.    Review of Systems  Cardiovascular: Positive for chest pain.  All other systems reviewed and are negative.     Allergies  Peanuts and Penicillins  Home Medications   Prior to Admission medications   Medication Sig Start Date End Date Taking? Authorizing Provider  methocarbamol (ROBAXIN) 500 MG tablet Take 1 tablet (500 mg total) by mouth 2 (two) times daily. 10/15/14   Heather Laisure, PA-C  naproxen (NAPROSYN) 500 MG tablet Take 1 tablet (500 mg total) by mouth 2 (two) times daily. 10/15/14   Heather Laisure, PA-C  naproxen (NAPROSYN) 500 MG tablet Take 1 tablet (500 mg total) by mouth 2 (two) times daily. 02/27/15   Harle Battiest, NP  Phenylephrine-DM-GG-APAP  5-10-200-325 MG TABS Take 2 tablets by mouth 3 (three) times daily. 02/27/15   Harle Battiest, NP  traMADol (ULTRAM) 50 MG tablet Take 1 tablet (50 mg total) by mouth every 6 (six) hours as needed. 10/15/14   Heather Laisure, PA-C   BP 135/80 mmHg  Pulse 65  Temp(Src) 98.3 F (36.8 C) (Oral)  Resp 16  Ht  (1.803 m)  Wt 170 lb (77.111 kg)  BMI 23.72 kg/m2  SpO2 99%   Physical Exam  Constitutional: He is oriented to person, place, and time. He appears well-developed and well-nourished. No distress.  NAD, laughing in room with wife  HENT:  Head: Normocephalic and atraumatic.  Mouth/Throat: Oropharynx is clear and moist.  Eyes: Conjunctivae and EOM are normal. Pupils are equal, round, and reactive to light.  Neck: Normal range of motion. Neck supple.  Cardiovascular: Normal rate, regular rhythm and normal heart sounds.   Pulmonary/Chest: Effort normal. No respiratory distress. He has decreased breath sounds. He has no wheezes. He has no rhonchi.  Decreased air movement bilaterally, no wheezes or rhonchi, no distress, speaking in full complete sentences without difficulty  Abdominal: Soft. Bowel sounds are normal. There is no tenderness. There is no guarding.  Musculoskeletal: Normal range of motion.  Neurological: He is alert and oriented to person, place, and time.  Skin: Skin is warm and dry. He is not diaphoretic.  Psychiatric: He has a normal mood and affect.  Nursing note and vitals reviewed.   ED  Course  Procedures (including critical care time) Labs Review Labs Reviewed  BASIC METABOLIC PANEL  CBC  I-STAT TROPOININ, ED    Imaging Review Dg Chest 2 View  05/23/2015   CLINICAL DATA:  Posterior chest pain and pain with inspiration for 3 hr. No known injury. Initial encounter.  EXAM: CHEST  2 VIEW  COMPARISON:  PA and lateral chest 02/26/2015 and 02/05/2012.  FINDINGS: Heart size and mediastinal contours are within normal limits. Both lungs are clear. Visualized  skeletal structures are unremarkable.  IMPRESSION: Normal exam.   Electronically Signed   By: Drusilla Kanner M.D.   On: 05/23/2015 10:23   I have personally reviewed and evaluated these images and lab results as part of my medical decision-making.   EKG Interpretation None      MDM   Final diagnoses:  Chest tightness   29 year old male here with chest tightness and pain with deep inspiration. He does have history of asthma but has not used his inhaler in several months. Triage note reports back pain-- no mention of this to me during any point of interview/exam.  Patient is afebrile, nontoxic. He is in no acute distress. He has had decreased breath sounds bilaterally without wheezes or rhonchi. Vital signs stable on room air. EKG sinus rhythm without acute ischemic changes. Lab work including troponin are within normal limits. Chest x-ray is clear. Patient will be given dose of prednisone and albuterol nebulizer treatment. Will reassess.  After medications, patient states he is feeling better. He has increased air movement throughout. His vital signs remained stable on room air. At this time, lower suspicion for ACS, PE, dissection, or other acute cardiac event at this time. Patient is PERC negative.  Symptoms more likely due to his asthma.  Rx prednisone taper for home.  [facility].  Discussed plan with patient, he/she acknowledged understanding and agreed with plan of care.  Return precautions given for new or worsening symptoms.   Garlon Hatchet, PA-C 05/23/15 1434  Bethann Berkshire, MD 05/24/15 [phone]

## 2016-08-14 ENCOUNTER — Emergency Department (HOSPITAL_COMMUNITY): Payer: Self-pay

## 2016-08-14 ENCOUNTER — Emergency Department (HOSPITAL_COMMUNITY)
Admission: EM | Admit: 2016-08-14 | Discharge: 2016-08-14 | Disposition: A | Discharge status: Home / Self Care | Payer: Self-pay | Attending: Emergency Medicine | Admitting: Emergency Medicine

## 2016-08-14 ENCOUNTER — Encounter (HOSPITAL_COMMUNITY): Payer: Self-pay | Admitting: Nurse Practitioner

## 2016-08-14 DIAGNOSIS — J45901 Unspecified asthma with (acute) exacerbation: Secondary | ICD-10-CM | POA: Insufficient documentation

## 2016-08-14 DIAGNOSIS — J069 Acute upper respiratory infection, unspecified: Secondary | ICD-10-CM

## 2016-08-14 DIAGNOSIS — F17200 Nicotine dependence, unspecified, uncomplicated: Secondary | ICD-10-CM | POA: Insufficient documentation

## 2016-08-14 DIAGNOSIS — Z91010 Allergy to peanuts: Secondary | ICD-10-CM | POA: Insufficient documentation

## 2016-08-14 HISTORY — DX: Bronchitis, not specified as acute or chronic: J40

## 2016-08-14 MED ORDER — CETIRIZINE-PSEUDOEPHEDRINE ER 5-120 MG PO TB12: 1.0000 | ORAL_TABLET | Freq: Two times a day (BID) | ORAL | 0 refills | Status: DC

## 2016-08-14 MED ORDER — ALBUTEROL SULFATE HFA 108 (90 BASE) MCG/ACT IN AERS
1.0000 | INHALATION_SPRAY | Freq: Once | RESPIRATORY_TRACT | Status: AC
  Administered 2016-08-14: 2 via RESPIRATORY_TRACT
  Filled 2016-08-14: qty 6.7

## 2016-08-14 MED ORDER — BENZONATATE 100 MG PO CAPS: 100.0000 mg | ORAL_CAPSULE | Freq: Three times a day (TID) | ORAL | 0 refills | Status: DC

## 2016-08-14 MED ORDER — PREDNISONE 20 MG PO TABS: 60.0000 mg | ORAL_TABLET | Freq: Every day | ORAL | 0 refills | Status: DC

## 2016-08-14 NOTE — ED Triage Notes (Signed)
Pt presents with c/o cough. The cough began about 2 weeks ago and has become progressively worse since onset. He reports rhinnorea, yellow sputum with cough, back pain. He denies fevers, sore throat, n/v. He has been unable to sleep well because of the cough. He tried ginger tea with no relief. He is alert and breathing easily

## 2016-08-14 NOTE — Discharge Instructions (Signed)
Medications: Albuterol inhaler, prednisone, Zyrtec-D, Tessalon  Treatment: Use albuterol inhaler every 4-6 hours as needed for shortness of breath, wheezing, cough. Take prednisone as prescribed for 5 days. Use Zyrtec-D twice daily as prescribed for nasal congestion and related allergy symptoms. Take Tessalon every 8 hours as needed for cough.  Follow-up: Please return to the emergency department if you develop any new or worsening symptoms including worsening shortness of breath, chest pain, fever, or any other concerning symptoms.

## 2016-08-14 NOTE — ED Provider Notes (Signed)
[facility]Y DEPT Provider Note   CSN: [id number] Arrival date & time: 08/14/16  1328  By signing my name below, I, Placido Sou, attest that this documentation has been prepared under the direction and in the presence of [employer], PA-C.  Electronically Signed: Placido Sou, ED Scribe. 08/14/16. 2:13 PM.    History   Chief Complaint Chief Complaint  Patient presents with  . Cough    HPI HPI Comments: Eric Stout is a 30 y.o. male with a h/o asthma, bronchitis and smoking who presents to the Emergency Department complaining of worsening, moderate, non-productive cough x 2 weeks. Pt reports associated SOB and wheezing at night, mild CP only when coughing, post nasal drip, rhinorrhea, and congestion. Pt denies having an inhaler at home. He denies fevers, chills, abdominal pain, nausea, vomiting, sore throat and ear pain.    The history is provided by the patient. No language interpreter was used.    Past Medical History:  Diagnosis Date  . Asthma   . Bronchitis     There are no active problems to display for this patient.   History reviewed. No pertinent surgical history.   Home Medications    Prior to Admission medications   Medication Sig Start Date End Date Taking? Authorizing Provider  benzonatate (TESSALON) 100 MG capsule Take 1 capsule (100 mg total) by mouth every 8 (eight) hours. 08/14/16   Lariyah Shetterly M Canuto Kingston, PA-C  cetirizine-pseudoephedrine (ZYRTEC-D) 5-120 MG tablet Take 1 tablet by mouth 2 (two) times daily. 08/14/16   Emi Holes, PA-C  methocarbamol (ROBAXIN) 500 MG tablet Take 1 tablet (500 mg total) by mouth 2 (two) times daily. Patient not taking: Reported on 05/23/2015 10/15/14   Santiago Glad, PA-C  naproxen (NAPROSYN) 500 MG tablet Take 1 tablet (500 mg total) by mouth 2 (two) times daily. Patient not taking: Reported on 05/23/2015 10/15/14   Santiago Glad, PA-C  naproxen (NAPROSYN) 500 MG tablet Take 1 tablet (500 mg total) by mouth 2  (two) times daily. Patient not taking: Reported on 05/23/2015 02/27/15   Harle Battiest, NP  Phenylephrine-DM-GG-APAP 5-10-200-325 MG TABS Take 2 tablets by mouth 3 (three) times daily. Patient not taking: Reported on 05/23/2015 02/27/15   Harle Battiest, NP  predniSONE (DELTASONE) 20 MG tablet Take 3 tablets (60 mg total) by mouth daily. 08/14/16   Emi Holes, PA-C  traMADol (ULTRAM) 50 MG tablet Take 1 tablet (50 mg total) by mouth every 6 (six) hours as needed. Patient not taking: Reported on 05/23/2015 10/15/14   Santiago Glad, PA-C    Family History History reviewed. No pertinent family history.  Social History Social History  Substance Use Topics  . Smoking status: Current Every Day Smoker    Packs/day: 0.25  . Smokeless tobacco: Never Used  . Alcohol use No     Comment: once in a while.     Allergies   Peanuts [peanut oil] and Penicillins   Review of Systems Review of Systems  Constitutional: Negative for chills and fever.  HENT: Positive for congestion, postnasal drip and rhinorrhea. Negative for ear discharge, ear pain and sore throat.   Respiratory: Positive for cough, shortness of breath and wheezing.   Cardiovascular: Positive for chest pain.  Gastrointestinal: Negative for abdominal pain, nausea and vomiting.   Physical Exam Updated Vital Signs BP 133/75 (BP Location: Right Arm)   Pulse 80   Temp 98.2 F (36.8 C) (Oral)   Resp 16   Ht 5\' 11"  (1.803 m)   Wt  81.6 kg   SpO2 99%   BMI 25.10 kg/m   Physical Exam  Constitutional: He appears well-developed and well-nourished. No distress.  HENT:  Head: Normocephalic and atraumatic.  Right Ear: Tympanic membrane normal.  Left Ear: Tympanic membrane normal.  Mouth/Throat: Oropharynx is clear and moist. No oropharyngeal exudate.  Eyes: Conjunctivae are normal. Pupils are equal, round, and reactive to light. Right eye exhibits no discharge. Left eye exhibits no discharge. No scleral icterus.  Neck:  Normal range of motion. Neck supple. No thyromegaly present.  Cardiovascular: Normal rate, regular rhythm, normal heart sounds and intact distal pulses.  Exam reveals no gallop and no friction rub.   No murmur heard. Pulmonary/Chest: Effort normal and breath sounds normal. No stridor. No respiratory distress. He has no wheezes. He has no rales.  Abdominal: Soft. Bowel sounds are normal. He exhibits no distension. There is no tenderness. There is no rebound and no guarding.  Musculoskeletal: He exhibits no edema.  Lymphadenopathy:    He has no cervical adenopathy.  Neurological: He is alert. Coordination normal.  Skin: Skin is warm and dry. No rash noted. He is not diaphoretic. No pallor.  Psychiatric: He has a normal mood and affect.  Nursing note and vitals reviewed.  ED Treatments / Results  Labs (all labs ordered are listed, but only abnormal results are displayed) Labs Reviewed - No data to display  EKG  EKG Interpretation None       Radiology Dg Chest 2 View  Result Date: 08/14/2016 CLINICAL DATA:  30 year old male with a history of cough EXAM: CHEST  2 VIEW COMPARISON:  05/23/2015 FINDINGS: The heart size and mediastinal contours are within normal limits. Both lungs are clear. The visualized skeletal structures are unremarkable. IMPRESSION: No acute cardiopulmonary disease. Signed, Yvone NeuJaime S. Loreta AveWagner, DO [facility] Electronically Signed   By: Gilmer MorJaime  Wagner D.O.   On: 08/14/2016 14:36    Procedures Procedures  DIAGNOSTIC STUDIES: Oxygen Saturation is 99% on RA, normal by my interpretation.    COORDINATION OF CARE: 2:13 PM Discussed next steps with pt. Pt verbalized understanding and is agreeable with the plan.    Medications Ordered in ED Medications  albuterol (PROVENTIL HFA;VENTOLIN HFA) 108 (90 Base) MCG/ACT inhaler 1-2 puff (2 puffs Inhalation Given 08/14/16 1439)     Initial Impression / Assessment and  Plan / ED Course  I have reviewed the triage vital signs and the nursing notes.  Pertinent labs & imaging results that were available during my care of the patient were reviewed by me and considered in my medical decision making (see chart for details).  Clinical Course     Pt symptoms consistent with URI, possible bronchitis. CXR negative for acute infiltrate. Patient was discharged with albuterol inhaler, 5 day burst of prednisone, Zyrtec-D, and Tessalon for cough. Oxygen saturations at 99% and above. Discussed return precautions.  Patient vitals stable throughout ED course and discharged in satisfactory condition.  I personally performed the services described in this documentation, which was scribed in my presence. The recorded information has been reviewed and is accurate.  Final Clinical Impressions(s) / ED Diagnoses   Final diagnoses:  Exacerbation of asthma, unspecified asthma severity, unspecified whether persistent  Upper respiratory tract infection, unspecified type    New Prescriptions Discharge Medication List as of 08/14/2016  2:45 PM    START taking these medications   Details  benzonatate (TESSALON) 100 MG capsule Take 1 capsule (100 mg total) by mouth every  8 (eight) hours., Starting Fri 08/14/2016, Print    cetirizine-pseudoephedrine (ZYRTEC-D) 5-120 MG tablet Take 1 tablet by mouth 2 (two) times daily., Starting Fri 08/14/2016, Print         Emi Holeslexandra M Hartlee Amedee, PA-C 08/14/16 1600    Laurence Spatesachel Morgan Little, MD 08/15/16 [phone]

## 2016-08-14 NOTE — ED Notes (Signed)
Pt returned from X-ray.  

## 2016-08-14 NOTE — ED Notes (Signed)
Patient transported to X-ray 

## 2016-08-19 ENCOUNTER — Encounter (HOSPITAL_COMMUNITY): Payer: Self-pay | Admitting: *Deleted

## 2016-08-19 ENCOUNTER — Emergency Department (HOSPITAL_COMMUNITY)
Admission: EM | Admit: 2016-08-19 | Discharge: 2016-08-19 | Disposition: A | Discharge status: Home / Self Care | Payer: Self-pay | Attending: Emergency Medicine | Admitting: Emergency Medicine

## 2016-08-19 DIAGNOSIS — J040 Acute laryngitis: Secondary | ICD-10-CM | POA: Insufficient documentation

## 2016-08-19 DIAGNOSIS — F17200 Nicotine dependence, unspecified, uncomplicated: Secondary | ICD-10-CM | POA: Insufficient documentation

## 2016-08-19 DIAGNOSIS — J45909 Unspecified asthma, uncomplicated: Secondary | ICD-10-CM | POA: Insufficient documentation

## 2016-08-19 DIAGNOSIS — Z79899 Other long term (current) drug therapy: Secondary | ICD-10-CM | POA: Insufficient documentation

## 2016-08-19 DIAGNOSIS — B9789 Other viral agents as the cause of diseases classified elsewhere: Secondary | ICD-10-CM

## 2016-08-19 DIAGNOSIS — Z91010 Allergy to peanuts: Secondary | ICD-10-CM | POA: Insufficient documentation

## 2016-08-19 DIAGNOSIS — J069 Acute upper respiratory infection, unspecified: Secondary | ICD-10-CM

## 2016-08-19 MED ORDER — PREDNISONE 20 MG PO TABS: 40.0000 mg | ORAL_TABLET | Freq: Every day | ORAL | 0 refills | Status: DC

## 2016-08-19 MED ORDER — DEXTROMETHORPHAN-GUAIFENESIN 15-400 MG PO TABS: 1.0000 | ORAL_TABLET | ORAL | 0 refills | Status: DC | PRN

## 2016-08-19 NOTE — Discharge Instructions (Signed)
You appear to have an upper respiratory infection (URI). An upper respiratory tract infection, or cold, is a viral infection of the air passages leading to the lungs. It is contagious and can be spread to others, especially during the first 3 or 4 days. It cannot be cured by antibiotics or other medicines. °RETURN IMMEDIATELY IF you develop shortness of breath, confusion or altered mental status, a new rash, become dizzy, faint, or poorly responsive, or are unable to be cared for at home. ° °

## 2016-08-19 NOTE — ED Provider Notes (Signed)
[facility] Provider Note   CSN: [id number] Arrival date & time: 08/19/16  1301  By signing my name below, I, Freida Busmaniana Omoyeni, attest that this documentation has been prepared under the direction and in the presence of non-physician practitioner, Arthor CaptainAbigail Samir Ishaq, PA-C. Electronically Signed: Freida Busmaniana Omoyeni, Scribe. 08/19/2016. 2:38 PM.   History   Chief Complaint Chief Complaint  Patient presents with  . URI     The history is provided by the patient. No language interpreter was used.     HPI Comments:  Eric Stout is a 30 y.o. male with a history of asthma, who presents to the Emergency Department complaining of a productive cough x 2 weeks. He reports associated hoarse voice, mild sore throat, and mild wheezing.  He has been using his inhaler with minimal relief. Pt was seen here for the same and discharged with Tessalon and Zyrtec but states he has not yet filled those prescriptions He denies fever,  He is a current smoker   Past Medical History:  Diagnosis Date  . Asthma   . Bronchitis     There are no active problems to display for this patient.   History reviewed. No pertinent surgical history.     Home Medications    Prior to Admission medications   Medication Sig Start Date End Date Taking? Authorizing Provider  Dextromethorphan-Guaifenesin 15-400 MG TABS Take 1 tablet by mouth every 4 (four) hours as needed. 08/19/16   Arthor CaptainAbigail Jesilyn Easom, PA-C  methocarbamol (ROBAXIN) 500 MG tablet Take 1 tablet (500 mg total) by mouth 2 (two) times daily. Patient not taking: Reported on 05/23/2015 10/15/14   Santiago GladHeather Laisure, PA-C  naproxen (NAPROSYN) 500 MG tablet Take 1 tablet (500 mg total) by mouth 2 (two) times daily. Patient not taking: Reported on 05/23/2015 10/15/14   Santiago GladHeather Laisure, PA-C  naproxen (NAPROSYN) 500 MG tablet Take 1 tablet (500 mg total) by mouth 2 (two) times daily. Patient not taking: Reported on 05/23/2015 02/27/15   Harle BattiestElizabeth Tysinger, NP    Phenylephrine-DM-GG-APAP 5-10-200-325 MG TABS Take 2 tablets by mouth 3 (three) times daily. Patient not taking: Reported on 05/23/2015 02/27/15   Harle BattiestElizabeth Tysinger, NP  predniSONE (DELTASONE) 20 MG tablet Take 2 tablets (40 mg total) by mouth daily. 08/19/16   Arthor CaptainAbigail Charee Tumblin, PA-C  traMADol (ULTRAM) 50 MG tablet Take 1 tablet (50 mg total) by mouth every 6 (six) hours as needed. Patient not taking: Reported on 05/23/2015 10/15/14   Santiago GladHeather Laisure, PA-C    Family History History reviewed. No pertinent family history.  Social History Social History  Substance Use Topics  . Smoking status: Current Every Day Smoker    Packs/day: 0.25  . Smokeless tobacco: Never Used  . Alcohol use No     Comment: once in a while.     Allergies   Peanuts [peanut oil] and Penicillins   Review of Systems Review of Systems  Constitutional: Negative for fever.  HENT: Positive for sore throat and voice change.   Respiratory: Positive for cough and wheezing.      Physical Exam Updated Vital Signs BP 143/75   Pulse 72   Temp 98.3 F (36.8 C) (Oral)   Resp 18   SpO2 98%   Physical Exam  Constitutional: He is oriented to person, place, and time. He appears well-developed and well-nourished. No distress.  HENT:  Head: Normocephalic.  Eyes: Conjunctivae are normal.  Neck: Normal range of motion. Neck supple.  Cardiovascular: Normal rate.   Pulmonary/Chest: Effort normal. He has  no wheezes.  Hoarse voice  Dry cough   Abdominal: He exhibits no distension.  Musculoskeletal: Normal range of motion.  Neurological: He is alert and oriented to person, place, and time.  Skin: Skin is warm and dry.  Psychiatric: He has a normal mood and affect.  Nursing note and vitals reviewed.    ED Treatments / Results  DIAGNOSTIC STUDIES:  Oxygen Saturation is 98% on RA, normal by my interpretation.    COORDINATION OF CARE:  2:25 PM Discussed treatment plan with pt at bedside and pt agreed to  plan.  Labs (all labs ordered are listed, but only abnormal results are displayed) Labs Reviewed - No data to display  EKG  EKG Interpretation None       Radiology No results found.  Procedures Procedures (including critical care time)  Medications Ordered in ED Medications - No data to display   Initial Impression / Assessment and Plan / ED Course  I have reviewed the triage vital signs and the nursing notes.  Pertinent labs & imaging results that were available during my care of the patient were reviewed by me and considered in my medical decision making (see chart for details).  Clinical Course       Pt symptoms consistent with URI.  Oxygen saturation is above 90%. No accessory muscle use, no cyanosis. Treated in the ED. Will discharge with Prednisone and cough meds. Pt instructed to follow up with PCP. Patient is hemodynamically stable. Discussed return precautions. Pt appears safe for discharge.     Final Clinical Impressions(s) / ED Diagnoses   Final diagnoses:  Viral URI with cough  Laryngitis    New Prescriptions New Prescriptions   DEXTROMETHORPHAN-GUAIFENESIN 15-400 MG TABS    Take 1 tablet by mouth every 4 (four) hours as needed.   PREDNISONE (DELTASONE) 20 MG TABLET    Take 2 tablets (40 mg total) by mouth daily.   I personally performed the services described in this documentation, which was scribed in my presence. The recorded information has been reviewed and is accurate.       Arthor Captainbigail Morningstar Toft, PA-C 08/19/16 1610    Lyndal Pulleyaniel Knott, MD 08/19/16 [phone]

## 2016-08-19 NOTE — ED Triage Notes (Signed)
Pt was here on 11/10 for cough and URI, was given prescriptions for zyrtec and tessalon but pt unable to fill the prescriptions and unable to work due to cough.

## 2016-08-19 NOTE — ED Triage Notes (Signed)
PT was seen 08-14-16 for asthma . Py has HHN but this is not working. Pt reports she was unable to fill other meds.Zyrtec and tessalon

## 2016-08-19 NOTE — ED Notes (Signed)
Declined W/C at D/C and was escorted to lobby by RN. 

## 2016-09-09 ENCOUNTER — Emergency Department (HOSPITAL_COMMUNITY)
Admission: EM | Admit: 2016-09-09 | Discharge: 2016-09-09 | Disposition: A | Discharge status: Home / Self Care | Payer: Self-pay | Attending: Emergency Medicine | Admitting: Emergency Medicine

## 2016-09-09 ENCOUNTER — Encounter (HOSPITAL_COMMUNITY): Payer: Self-pay

## 2016-09-09 DIAGNOSIS — J45909 Unspecified asthma, uncomplicated: Secondary | ICD-10-CM | POA: Insufficient documentation

## 2016-09-09 DIAGNOSIS — F17200 Nicotine dependence, unspecified, uncomplicated: Secondary | ICD-10-CM | POA: Insufficient documentation

## 2016-09-09 DIAGNOSIS — Z91010 Allergy to peanuts: Secondary | ICD-10-CM | POA: Insufficient documentation

## 2016-09-09 DIAGNOSIS — J069 Acute upper respiratory infection, unspecified: Secondary | ICD-10-CM | POA: Insufficient documentation

## 2016-09-09 NOTE — ED Triage Notes (Signed)
Patient complains of stomach virus on 12/2. Now here with ongoing dry cough and nausea. Reports that he had 1 episode of vomiting today. Denies abdominal pain

## 2016-09-09 NOTE — ED Provider Notes (Signed)
[facility] Provider Note   CSN: [id number] Arrival date & time: 09/09/16  [id number]     History   Chief Complaint Chief Complaint  Patient presents with  . cough, nausea    HPI Eric PleasureJames Kibby is a 30 y.o. male.  The history is provided by the patient and medical records. No language interpreter was used.   Eric Stout is a 30 y.o. male  with a PMH of asthma who presents to the Emergency Department complaining of improving cough and nasal congestion for the last week. Patient also states that he had the stomach virus on December 2, causing him to miss work. He states that he had nausea, vomiting, diarrhea for a day or 2 which then resolved. Daughter at bedside had similar symptoms. Patient reports that today he was very worried because he found out that his mother was missing. He filed a police report and got very anxious, then had 1 episode of emesis. He has no abdominal pain or nausea at this time. No fevers, back pain, dysuria, chest pain, shortness of breath. He has had no further episodes of emesis and is currently without complaints.  Past Medical History:  Diagnosis Date  . Asthma   . Bronchitis     There are no active problems to display for this patient.   History reviewed. No pertinent surgical history.     Home Medications    Prior to Admission medications   Medication Sig Start Date End Date Taking? Authorizing Provider  Dextromethorphan-Guaifenesin 15-400 MG TABS Take 1 tablet by mouth every 4 (four) hours as needed. Patient not taking: Reported on 09/09/2016 08/19/16   Arthor CaptainAbigail Harris, PA-C  methocarbamol (ROBAXIN) 500 MG tablet Take 1 tablet (500 mg total) by mouth 2 (two) times daily. Patient not taking: Reported on 09/09/2016 10/15/14   Santiago GladHeather Laisure, PA-C  naproxen (NAPROSYN) 500 MG tablet Take 1 tablet (500 mg total) by mouth 2 (two) times daily. Patient not taking: Reported on 09/09/2016 10/15/14   Santiago GladHeather Laisure, PA-C  naproxen (NAPROSYN) 500 MG  tablet Take 1 tablet (500 mg total) by mouth 2 (two) times daily. Patient not taking: Reported on 09/09/2016 02/27/15   Harle BattiestElizabeth Tysinger, NP  Phenylephrine-DM-GG-APAP 5-10-200-325 MG TABS Take 2 tablets by mouth 3 (three) times daily. Patient not taking: Reported on 09/09/2016 02/27/15   Harle BattiestElizabeth Tysinger, NP  predniSONE (DELTASONE) 20 MG tablet Take 2 tablets (40 mg total) by mouth daily. Patient not taking: Reported on 09/09/2016 08/19/16   Arthor CaptainAbigail Harris, PA-C  traMADol (ULTRAM) 50 MG tablet Take 1 tablet (50 mg total) by mouth every 6 (six) hours as needed. Patient not taking: Reported on 09/09/2016 10/15/14   Santiago GladHeather Laisure, PA-C    Family History No family history on file.  Social History Social History  Substance Use Topics  . Smoking status: Current Every Day Smoker    Packs/day: 0.25  . Smokeless tobacco: Never Used  . Alcohol use No     Comment: once in a while.     Allergies   Peanuts [peanut oil] and Penicillins   Review of Systems Review of Systems  Constitutional: Negative for chills and fever.  HENT: Positive for congestion.   Eyes: Negative for visual disturbance.  Respiratory: Positive for cough. Negative for shortness of breath and wheezing.   Cardiovascular: Negative.   Gastrointestinal: Positive for vomiting. Negative for abdominal pain and nausea.  Genitourinary: Negative for dysuria.  Musculoskeletal: Negative for back pain.  Skin: Negative for rash.  Neurological: Negative for  headaches.     Physical Exam Updated Vital Signs BP 117/69 (BP Location: Left Arm)   Pulse 95   Temp 98.3 F (36.8 C) (Oral)   Resp 18   SpO2 96%   Physical Exam  Constitutional: He is oriented to person, place, and time. He appears well-developed and well-nourished. No distress.  HENT:  Head: Normocephalic and atraumatic.  OP with erythema, no exudates or tonsillar hypertrophy. + nasal congestion with mucosal edema.   Neck: Normal range of motion. Neck supple.  No  meningeal signs.   Cardiovascular: Normal rate, regular rhythm and normal heart sounds.   Pulmonary/Chest: Effort normal.  Lungs are clear to auscultation bilaterally - no w/r/r  Abdominal: Soft. Bowel sounds are normal. He exhibits no distension. There is no tenderness.  Musculoskeletal: Normal range of motion.  Neurological: He is alert and oriented to person, place, and time.  Skin: Skin is warm and dry. He is not diaphoretic.  Nursing note and vitals reviewed.    ED Treatments / Results  Labs (all labs ordered are listed, but only abnormal results are displayed) Labs Reviewed - No data to display  EKG  EKG Interpretation None       Radiology No results found.  Procedures Procedures (including critical care time)  Medications Ordered in ED Medications - No data to display   Initial Impression / Assessment and Plan / ED Course  I have reviewed the triage vital signs and the nursing notes.  Pertinent labs & imaging results that were available during my care of the patient were reviewed by me and considered in my medical decision making (see chart for details).  Clinical Course    Eric PleasureJames Sigley is a 30 y.o. male who presents to ED for resolved n/v/d and improving uri symptoms. He had one episode of emesis today which occurred under a very stressful, anxious setting as his mother has gone missing. Police report was filed and during this process he had one episode of emesis. He has had no further episodes of emesis and state all abdominal symptoms have resolved. On exam, patient is afebrile hemodynamically stable and nontoxic appearing. He has clear lung sounds bilaterally and a benign abdominal exam. 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 Evaluation does not show pathology that would require ongoing emergent intervention or inpatient treatment. Patient is hemodynamically stable and mentating appropriately. Return precautions discussed and all questions  answered.    Final Clinical Impressions(s) / ED Diagnoses   Final diagnoses:  None    New Prescriptions New Prescriptions   No medications on file     [facility] CenterJaime Pilcher Toni Hoffmeister, PA-C 09/09/16 2152    Benjiman CoreNathan Pickering, MD 09/09/16 2342

## 2016-09-09 NOTE — ED Notes (Addendum)
Patient refused to have labs done at triage. Doesn't want test done.

## 2016-09-09 NOTE — Discharge Instructions (Signed)
Please follow up with your primary doctor for discussion of your diagnoses and further evaluation after today's visit if symptoms persist longer than 10 days; Return to the ER for high fevers, difficulty breathing or other concerning symptoms

## 2016-10-02 ENCOUNTER — Emergency Department (HOSPITAL_COMMUNITY): Payer: Self-pay

## 2016-10-02 ENCOUNTER — Encounter (HOSPITAL_COMMUNITY): Payer: Self-pay | Admitting: *Deleted

## 2016-10-02 ENCOUNTER — Emergency Department (HOSPITAL_COMMUNITY)
Admission: EM | Admit: 2016-10-02 | Discharge: 2016-10-03 | Disposition: A | Discharge status: Home / Self Care | Payer: Self-pay | Attending: Emergency Medicine | Admitting: Emergency Medicine

## 2016-10-02 DIAGNOSIS — F17200 Nicotine dependence, unspecified, uncomplicated: Secondary | ICD-10-CM | POA: Insufficient documentation

## 2016-10-02 DIAGNOSIS — J45909 Unspecified asthma, uncomplicated: Secondary | ICD-10-CM | POA: Insufficient documentation

## 2016-10-02 DIAGNOSIS — R091 Pleurisy: Secondary | ICD-10-CM | POA: Insufficient documentation

## 2016-10-02 DIAGNOSIS — Z91010 Allergy to peanuts: Secondary | ICD-10-CM | POA: Insufficient documentation

## 2016-10-02 DIAGNOSIS — Z79899 Other long term (current) drug therapy: Secondary | ICD-10-CM | POA: Insufficient documentation

## 2016-10-02 LAB — CBC
HCT: 41.7 % (ref 39.0–52.0)
Hemoglobin: 14.4 g/dL (ref 13.0–17.0)
MCH: 30.6 pg (ref 26.0–34.0)
MCHC: 34.5 g/dL (ref 30.0–36.0)
MCV: 88.5 fL (ref 78.0–100.0)
PLATELETS: 258 10*3/uL (ref 150–400)
RBC: 4.71 MIL/uL (ref 4.22–5.81)
RDW: 11.8 % (ref 11.5–15.5)
WBC: 11.4 10*3/uL — ABNORMAL HIGH (ref 4.0–10.5)

## 2016-10-02 LAB — BASIC METABOLIC PANEL
Anion gap: 10 (ref 5–15)
BUN: 8 mg/dL (ref 6–20)
CO2: 25 mmol/L (ref 22–32)
CREATININE: 0.99 mg/dL (ref 0.61–1.24)
Calcium: 9.3 mg/dL (ref 8.9–10.3)
Chloride: 103 mmol/L (ref 101–111)
GFR calc Af Amer: >60 mL/min (ref >60)
GFR calc non Af Amer: >60 mL/min (ref >60)
Glucose, Bld: 89 mg/dL (ref 65–99)
Potassium: 3.9 mmol/L (ref 3.5–5.1)
Sodium: 138 mmol/L (ref 135–145)

## 2016-10-02 LAB — TROPONIN I: Troponin I: <0.03 ng/mL (ref <0.03)

## 2016-10-02 NOTE — ED Triage Notes (Signed)
The pt is sob for 2-3 days with chest pain with movement and inspiration.   Unknown temp .  ibu taken one hour ago posterior chest pain.

## 2016-10-02 NOTE — ED Notes (Signed)
The pt does not want tylenol

## 2016-10-03 MED ORDER — OXYCODONE HCL 5 MG PO TABS
5.0000 mg | ORAL_TABLET | Freq: Once | ORAL | Status: AC
  Administered 2016-10-03: 5 mg via ORAL
  Filled 2016-10-03: qty 1

## 2016-10-03 MED ORDER — IBUPROFEN 800 MG PO TABS
800.0000 mg | ORAL_TABLET | Freq: Once | ORAL | Status: AC
  Administered 2016-10-03: 800 mg via ORAL
  Filled 2016-10-03: qty 1

## 2016-10-03 MED ORDER — DIAZEPAM 5 MG PO TABS
5.0000 mg | ORAL_TABLET | Freq: Once | ORAL | Status: AC
  Administered 2016-10-03: 5 mg via ORAL
  Filled 2016-10-03: qty 1

## 2016-10-03 MED ORDER — ACETAMINOPHEN 500 MG PO TABS
1000.0000 mg | ORAL_TABLET | Freq: Once | ORAL | Status: AC
  Administered 2016-10-03: 1000 mg via ORAL
  Filled 2016-10-03: qty 2

## 2016-10-03 NOTE — Discharge Instructions (Signed)
Take tylenol 2 pills 4 times a day and motrin 4 pills 3 times a day.  Drink plenty of fluids.  Return for worsening shortness of breath, headache, confusion. Follow up with your family doctor.   

## 2016-10-03 NOTE — ED Provider Notes (Addendum)
[facility]-EMERGENCY DEPT Provider Note   CSN: [id number] Arrival date & time: 10/02/16  1943   By signing my name below, I, Nelwyn Salisbury, attest that this documentation has been prepared under the direction and in the presence of Melene Plan, DO . Electronically Signed: Nelwyn Salisbury, Scribe. 10/03/2016. 12:17 AM.  History   Chief Complaint Chief Complaint  Patient presents with  . Shortness of Breath   The history is provided by the patient and a relative. No language interpreter was used.  Shortness of Breath  This is a new problem. The average episode lasts 3 days. The problem occurs frequently.The current episode started more than 2 days ago. The problem has not changed since onset.Associated symptoms include cough and chest pain. Pertinent negatives include no fever, no headaches, no vomiting, no abdominal pain, no rash and no leg swelling. It is unknown what precipitated the problem. He has tried inhaled steroids (Otc painkillers) for the symptoms. The treatment provided no relief. Associated medical issues include asthma.    HPI Comments:  Eric Stout is a 30 y.o. male with pmhx of Asthma and Bronchitis who presents to the Emergency Department complaining of waxing/waning constant chest pain beginning a few days ago. Pt describes his pain as a sharp stabbing pain that radiates around his left side and into his back. He reports associated SOB, cough, and congestion. Pt has tried at-home ibuprofen, tylenol and his inhaler with minimal relief. He denies any lower extremity swelling. Pt has no history of recent hospital visits, hormone therapy or hx of blood clots.  Past Medical History:  Diagnosis Date  . Asthma   . Bronchitis     There are no active problems to display for this patient.   History reviewed. No pertinent surgical history.     Home Medications    Prior to Admission medications   Medication Sig Start Date End Date Taking? Authorizing Provider    Dextromethorphan-Guaifenesin 15-400 MG TABS Take 1 tablet by mouth every 4 (four) hours as needed. Patient not taking: Reported on 09/09/2016 08/19/16   Arthor Captain, PA-C  methocarbamol (ROBAXIN) 500 MG tablet Take 1 tablet (500 mg total) by mouth 2 (two) times daily. Patient not taking: Reported on 09/09/2016 10/15/14   Santiago Glad, PA-C  naproxen (NAPROSYN) 500 MG tablet Take 1 tablet (500 mg total) by mouth 2 (two) times daily. Patient not taking: Reported on 09/09/2016 10/15/14   Santiago Glad, PA-C  naproxen (NAPROSYN) 500 MG tablet Take 1 tablet (500 mg total) by mouth 2 (two) times daily. Patient not taking: Reported on 09/09/2016 02/27/15   Harle Battiest, NP  Phenylephrine-DM-GG-APAP 5-10-200-325 MG TABS Take 2 tablets by mouth 3 (three) times daily. Patient not taking: Reported on 09/09/2016 02/27/15   Harle Battiest, NP  predniSONE (DELTASONE) 20 MG tablet Take 2 tablets (40 mg total) by mouth daily. Patient not taking: Reported on 09/09/2016 08/19/16   Arthor Captain, PA-C  traMADol (ULTRAM) 50 MG tablet Take 1 tablet (50 mg total) by mouth every 6 (six) hours as needed. Patient not taking: Reported on 09/09/2016 10/15/14   Santiago Glad, PA-C    Family History No family history on file.  Social History Social History  Substance Use Topics  . Smoking status: Current Every Day Smoker    Packs/day: 0.25  . Smokeless tobacco: Never Used  . Alcohol use No     Comment: once in a while.     Allergies   Peanuts [peanut oil] and Penicillins   Review of  Systems Review of Systems  Constitutional: Negative for chills and fever.  HENT: Positive for congestion. Negative for facial swelling.   Eyes: Negative for discharge and visual disturbance.  Respiratory: Positive for cough and shortness of breath.   Cardiovascular: Positive for chest pain. Negative for palpitations and leg swelling.  Gastrointestinal: Negative for abdominal pain, diarrhea and vomiting.   Musculoskeletal: Negative for arthralgias and myalgias.  Skin: Negative for color change and rash.  Neurological: Negative for tremors, syncope and headaches.  Psychiatric/Behavioral: Negative for confusion and dysphoric mood.  All other systems reviewed and are negative.    Physical Exam Updated Vital Signs BP 130/71 (BP Location: Left Arm)   Pulse 72   Temp 99.1 F (37.3 C) (Oral)   Resp 18   Ht 5\' 10"  (1.778 m)   Wt 180 lb (81.6 kg)   SpO2 99%   BMI 25.83 kg/m   Physical Exam  Constitutional: He is oriented to person, place, and time. He appears well-developed and well-nourished.  HENT:  Head: Normocephalic and atraumatic.  Eyes: EOM are normal. Pupils are equal, round, and reactive to light.  Neck: Normal range of motion. Neck supple. No JVD present.  Cardiovascular: Normal rate and regular rhythm.  Exam reveals no gallop and no friction rub.   No murmur heard. Pulmonary/Chest: Breath sounds normal. No respiratory distress. He has no wheezes.  Abdominal: He exhibits no distension. There is no rebound and no guarding.  Musculoskeletal: Normal range of motion.  Neurological: He is alert and oriented to person, place, and time.  Skin: No rash noted. No pallor.  Psychiatric: He has a normal mood and affect. His behavior is normal.  Nursing note and vitals reviewed.    ED Treatments / Results  DIAGNOSTIC STUDIES:  Oxygen Saturation is 99% on RA, normal by my interpretation.    COORDINATION OF CARE:  1:04 AM Discussed treatment plan with pt at bedside which includes otc medications for symptomatic management and pt agreed to plan.  Labs (all labs ordered are listed, but only abnormal results are displayed) Labs Reviewed  CBC - Abnormal; Notable for the following:       Result Value   WBC 11.4 (*)    All other components within normal limits  BASIC METABOLIC PANEL  TROPONIN I    EKG  EKG Interpretation  Date/Time:  Friday October 02 2016 20:59:29  EST Ventricular Rate:  76 PR Interval:  174 QRS Duration: 88 QT Interval:  346 QTC Calculation: 389 R Axis:   82 Text Interpretation:  Normal sinus rhythm Possible Left atrial enlargement Early repolarization Borderline ECG No significant change since last tracing Confirmed by Mumtaz Lovins MD, DANIEL [phone]([id number]) on 10/03/2016 12:15:00 AM       Radiology Dg Chest 2 View  Result Date: 10/02/2016 CLINICAL DATA:  Acute onset of left posterior chest pain. Difficulty breathing. Initial encounter. EXAM: CHEST  2 VIEW COMPARISON:  Chest radiograph performed 08/14/2016 FINDINGS: The lungs are well-aerated and clear. There is no evidence of focal opacification, pleural effusion or pneumothorax. The heart is normal in size; the mediastinal contour is within normal limits. No acute osseous abnormalities are seen. IMPRESSION: No acute cardiopulmonary process seen. Electronically Signed   By: Roanna RaiderJeffery  Chang M.D.   On: 10/02/2016 21:31    Procedures Procedures (including critical care time)  Medications Ordered in ED Medications  acetaminophen (TYLENOL) tablet 1,000 mg (not administered)  ibuprofen (ADVIL,MOTRIN) tablet 800 mg (not administered)  oxyCODONE (Oxy IR/ROXICODONE) immediate release tablet 5 mg (  not administered)  diazepam (VALIUM) tablet 5 mg (not administered)     Initial Impression / Assessment and Plan / ED Course  I have reviewed the triage vital signs and the nursing notes.  Pertinent labs & imaging results that were available during my care of the patient were reviewed by me and considered in my medical decision making (see chart for details).  Clinical Course     30 yo M With a chief complaint of left-sided sharp chest pain. Worse with breathing twisting moving the left arm. Chest x-ray is negative for pneumonia. Patient is well-appearing and nontoxic. Has clear lung sounds. Suspect that this is pleurisy or muscle strain secondary to coughing. Suggested he get.on ibuprofen and follow  with his family physician.  1:24 AM:  I have discussed the diagnosis/risks/treatment options with the patient and family and believe the pt to be eligible for discharge home to follow-up with PCP. We also discussed returning to the ED immediately if new or worsening sx occur. We discussed the sx which are most concerning (e.g., sudden worsening pain, fever, inability to tolerate by mouth) that necessitate immediate return. Medications administered to the patient during their visit and any new prescriptions provided to the patient are listed below.  Medications given during this visit Medications  acetaminophen (TYLENOL) tablet 1,000 mg (not administered)  ibuprofen (ADVIL,MOTRIN) tablet 800 mg (not administered)  oxyCODONE (Oxy IR/ROXICODONE) immediate release tablet 5 mg (not administered)  diazepam (VALIUM) tablet 5 mg (not administered)     The patient appears reasonably screen and/or stabilized for discharge and I doubt any other medical condition or other [facility] requiring further screening, evaluation, or treatment in the ED at this time prior to discharge.    Final Clinical Impressions(s) / ED Diagnoses   Final diagnoses:  Pleurisy    New Prescriptions New Prescriptions   No medications on file   I personally performed the services described in this documentation, which was scribed in my presence. The recorded information has been reviewed and is accurate.     Melene Planan Paxon Propes, DO 10/03/16 0124    Melene Planan Braniyah Besse, DO 10/03/16 [id number]

## 2018-01-21 ENCOUNTER — Emergency Department (HOSPITAL_COMMUNITY)
Admission: EM | Admit: 2018-01-21 | Discharge: 2018-01-21 | Disposition: A | Discharge status: Home / Self Care | Payer: Self-pay | Attending: Emergency Medicine | Admitting: Emergency Medicine

## 2018-01-21 ENCOUNTER — Emergency Department (HOSPITAL_COMMUNITY): Payer: Self-pay

## 2018-01-21 ENCOUNTER — Other Ambulatory Visit: Payer: Self-pay

## 2018-01-21 ENCOUNTER — Encounter (HOSPITAL_COMMUNITY): Payer: Self-pay

## 2018-01-21 DIAGNOSIS — Y999 Unspecified external cause status: Secondary | ICD-10-CM | POA: Insufficient documentation

## 2018-01-21 DIAGNOSIS — J45909 Unspecified asthma, uncomplicated: Secondary | ICD-10-CM | POA: Insufficient documentation

## 2018-01-21 DIAGNOSIS — F17210 Nicotine dependence, cigarettes, uncomplicated: Secondary | ICD-10-CM | POA: Insufficient documentation

## 2018-01-21 DIAGNOSIS — Y929 Unspecified place or not applicable: Secondary | ICD-10-CM | POA: Insufficient documentation

## 2018-01-21 DIAGNOSIS — M62838 Other muscle spasm: Secondary | ICD-10-CM

## 2018-01-21 DIAGNOSIS — M62830 Muscle spasm of back: Secondary | ICD-10-CM | POA: Insufficient documentation

## 2018-01-21 DIAGNOSIS — X500XXA Overexertion from strenuous movement or load, initial encounter: Secondary | ICD-10-CM | POA: Insufficient documentation

## 2018-01-21 DIAGNOSIS — Z79899 Other long term (current) drug therapy: Secondary | ICD-10-CM | POA: Insufficient documentation

## 2018-01-21 DIAGNOSIS — Y939 Activity, unspecified: Secondary | ICD-10-CM | POA: Insufficient documentation

## 2018-01-21 DIAGNOSIS — S39012A Strain of muscle, fascia and tendon of lower back, initial encounter: Secondary | ICD-10-CM

## 2018-01-21 LAB — BASIC METABOLIC PANEL
ANION GAP: 10 (ref 5–15)
BUN: 8 mg/dL (ref 6–20)
CALCIUM: 9.1 mg/dL (ref 8.9–10.3)
CHLORIDE: 105 mmol/L (ref 101–111)
CO2: 23 mmol/L (ref 22–32)
Creatinine, Ser: 0.89 mg/dL (ref 0.61–1.24)
GFR calc non Af Amer: >60 mL/min (ref >60)
GLUCOSE: 97 mg/dL (ref 65–99)
Potassium: 3.9 mmol/L (ref 3.5–5.1)
Sodium: 138 mmol/L (ref 135–145)

## 2018-01-21 LAB — CBC
HEMATOCRIT: 43.3 % (ref 39.0–52.0)
HEMOGLOBIN: 14.3 g/dL (ref 13.0–17.0)
MCH: 30 pg (ref 26.0–34.0)
MCHC: 33 g/dL (ref 30.0–36.0)
MCV: 91 fL (ref 78.0–100.0)
Platelets: 261 10*3/uL (ref 150–400)
RBC: 4.76 MIL/uL (ref 4.22–5.81)
RDW: 11.8 % (ref 11.5–15.5)
WBC: 11.2 10*3/uL — ABNORMAL HIGH (ref 4.0–10.5)

## 2018-01-21 MED ORDER — IBUPROFEN 800 MG PO TABS: 800.0000 mg | ORAL_TABLET | Freq: Three times a day (TID) | ORAL | 0 refills | Status: AC

## 2018-01-21 MED ORDER — CYCLOBENZAPRINE HCL 10 MG PO TABS: 10.0000 mg | ORAL_TABLET | Freq: Two times a day (BID) | ORAL | 0 refills | Status: DC | PRN

## 2018-01-21 NOTE — ED Triage Notes (Signed)
Patient here from home with back pain, with history of bronchitis. Said hurts to take a deep breath.  Denies any chest pain at this time.

## 2018-01-21 NOTE — ED Provider Notes (Signed)
[facility]   CSN: [id number] Arrival date & time: 01/21/18  1321     History   Chief Complaint Chief Complaint  Patient presents with  . Back Pain  . Shortness of Breath    HPI Eric Stout is a 32 y.o. male.  The history is provided by the patient.  Back Pain   This is a new problem. The current episode started 2 days ago. The problem occurs constantly. The problem has not changed since onset.Associated with: Pain after lifting boxes at work. The pain is present in the thoracic spine. The quality of the pain is described as aching. The pain does not radiate. The pain is at a severity of 4/10. The pain is mild. The symptoms are aggravated by twisting and certain positions. The pain is the same all the time. Pertinent negatives include no chest pain, no fever, no numbness, no weight loss, no headaches, no abdominal pain, no abdominal swelling, no bowel incontinence, no perianal numbness, no bladder incontinence, no dysuria, no pelvic pain, no leg pain, no paresthesias, no paresis, no tingling and no weakness. He has tried nothing for the symptoms. Risk factors: asthma.    Past Medical History:  Diagnosis Date  . Asthma   . Bronchitis     There are no active problems to display for this patient.   History reviewed. No pertinent surgical history.      Home Medications    Prior to Admission medications   Medication Sig Start Date End Date Taking? Authorizing Provider  cyclobenzaprine (FLEXERIL) 10 MG tablet Take 1 tablet (10 mg total) by mouth 2 (two) times daily as needed for muscle spasms. 01/21/18   Matisyn Cabeza, DO  Dextromethorphan-Guaifenesin 15-400 MG TABS Take 1 tablet by mouth every 4 (four) hours as needed. Patient not taking: Reported on 09/09/2016 08/19/16   Arthor CaptainHarris, Abigail, PA-C  ibuprofen (ADVIL,MOTRIN) 800 MG tablet Take 1 tablet (800 mg total) by mouth 3 (three) times daily for 5 days. 01/21/18 01/26/18   Abrea Henle, DO  methocarbamol (ROBAXIN) 500 MG tablet Take 1 tablet (500 mg total) by mouth 2 (two) times daily. Patient not taking: Reported on 09/09/2016 10/15/14   Santiago GladLaisure, Heather, PA-C  naproxen (NAPROSYN) 500 MG tablet Take 1 tablet (500 mg total) by mouth 2 (two) times daily. Patient not taking: Reported on 09/09/2016 10/15/14   Santiago GladLaisure, Heather, PA-C  naproxen (NAPROSYN) 500 MG tablet Take 1 tablet (500 mg total) by mouth 2 (two) times daily. Patient not taking: Reported on 09/09/2016 02/27/15   Harle Battiestysinger, Elizabeth, NP  Phenylephrine-DM-GG-APAP 5-10-200-325 MG TABS Take 2 tablets by mouth 3 (three) times daily. Patient not taking: Reported on 09/09/2016 02/27/15   Harle Battiestysinger, Elizabeth, NP  predniSONE (DELTASONE) 20 MG tablet Take 2 tablets (40 mg total) by mouth daily. Patient not taking: Reported on 09/09/2016 08/19/16   Arthor CaptainHarris, Abigail, PA-C  traMADol (ULTRAM) 50 MG tablet Take 1 tablet (50 mg total) by mouth every 6 (six) hours as needed. Patient not taking: Reported on 09/09/2016 10/15/14   Santiago GladLaisure, Heather, PA-C    Family History History reviewed. No pertinent family history.  Social History Social History   Tobacco Use  . Smoking status: Current Every Day Smoker    Packs/day: 0.25  . Smokeless tobacco: Never Used  Substance Use Topics  . Alcohol use: No    Comment: once in a while.  . Drug use: Yes    Types: Marijuana     Allergies  Peanuts [peanut oil] and Penicillins   Review of Systems Review of Systems  Constitutional: Negative for chills, fever and weight loss.  HENT: Negative for ear pain and sore throat.   Eyes: Negative for pain and visual disturbance.  Respiratory: Negative for cough and shortness of breath.   Cardiovascular: Negative for chest pain and palpitations.  Gastrointestinal: Negative for abdominal pain, bowel incontinence and vomiting.  Genitourinary: Negative for bladder incontinence, dysuria, hematuria and pelvic pain.  Musculoskeletal:  Positive for back pain. Negative for arthralgias, gait problem, myalgias and neck pain.  Skin: Negative for color change and rash.  Neurological: Negative for tingling, seizures, syncope, weakness, numbness, headaches and paresthesias.  All other systems reviewed and are negative.    Physical Exam Updated Vital Signs  ED Triage Vitals  Enc Vitals Group     BP 01/21/18 1537 117/66     Pulse Rate 01/21/18 1537 68     Resp 01/21/18 1537 16     Temp 01/21/18 1537 98.3 F (36.8 C)     Temp Source 01/21/18 1537 Oral     SpO2 01/21/18 1537 97 %     Weight --      Height --      Head Circumference --      Peak Flow --      Pain Score 01/21/18 1547 7     Pain Loc --      Pain Edu? --      Excl. in GC? --     Physical Exam  Constitutional: He is oriented to person, place, and time. He appears well-developed and well-nourished.  HENT:  Head: Normocephalic and atraumatic.  Eyes: Pupils are equal, round, and reactive to light. Conjunctivae are normal.  Neck: Normal range of motion. Neck supple.  Cardiovascular: Normal rate and regular rhythm.  No murmur heard. Pulmonary/Chest: Effort normal and breath sounds normal. No respiratory distress.  Abdominal: Soft. There is no tenderness.  Musculoskeletal: Normal range of motion. He exhibits no edema.       Right lower leg: He exhibits no tenderness and no edema.       Left lower leg: He exhibits no tenderness.  TTP to paraspinal muscles in thoracic spine, 5+/5 strength, normal sensation  Neurological: He is alert and oriented to person, place, and time.  Skin: Skin is warm and dry. Capillary refill takes less than 2 seconds.  Psychiatric: He has a normal mood and affect.  Nursing note and vitals reviewed.    ED Treatments / Results  Labs (all labs ordered are listed, but only abnormal results are displayed) Labs Reviewed  CBC - Abnormal; Notable for the following components:      Result Value   WBC 11.2 (*)    All other  components within normal limits  BASIC METABOLIC PANEL    EKG None  Radiology Dg Chest 2 View  Result Date: 01/21/2018 CLINICAL DATA:  Shortness of breath. EXAM: CHEST - 2 VIEW COMPARISON:  Chest x-ray dated October 02, 2016. FINDINGS: The heart size and mediastinal contours are within normal limits. Normal pulmonary vascularity. No focal consolidation, pleural effusion, or pneumothorax. No acute osseous abnormality. IMPRESSION: Normal chest x-ray. Electronically Signed   By: Obie Dredge M.D.   On: 01/21/2018 16:55    Procedures Procedures (including critical care time)  Medications Ordered in ED Medications - No data to display   Initial Impression / Assessment and Plan / ED Course  I have reviewed the triage vital signs and the  nursing notes.  Pertinent labs & imaging results that were available during my care of the patient were reviewed by me and considered in my medical decision making (see chart for details).     Latavius Capizzi is a 32 year old male with history of asthma who presents to the ED with back pain.  Patient with normal vitals.  No fever.  Patient states that he injured his back yesterday while lifting boxes at work.  He states that every time he moves in certain direction he gets some back pain and spasms.  He has not tried anything to help with the symptoms.  He is concerned that possibly he has asthma exacerbation and came for evaluation as well.  Patient does not have any fever or infectious symptoms.  He has clear breath sounds on exam and no wheezing and doubt bronchitis or asthma.  Patient has tenderness over the paraspinal muscles in his thoracic spine consistent with a muscle spasm.  Patient with likely muscle strain versus spasm.  No concern for respiratory process at this time.  Patient had chest x-ray that showed no signs of pneumonia, pneumothorax, pleural effusion, fractures.  Patient with unremarkable labs.  No significant anemia, electrolyte  abnormality, kidney injury.  Patient given prescription for Motrin and Flexeril and discharged from the ED in good condition.  Told to return to the ED if symptoms worsen and recommend follow-up with primary care provider.  Final Clinical Impressions(s) / ED Diagnoses   Final diagnoses:  Back strain, initial encounter  Muscle spasm    ED Discharge Orders        Ordered    ibuprofen (ADVIL,MOTRIN) 800 MG tablet  3 times daily     01/21/18 1835    cyclobenzaprine (FLEXERIL) 10 MG tablet  2 times daily PRN     01/21/18 1835       Virgina Norfolk, DO 01/21/18 2318    Rolland Porter, MD 01/30/18 1539

## 2018-01-21 NOTE — ED Notes (Signed)
Patient given discharge instructions and verbalized understanding.  Patient stable to discharge at this time.  Patient is alert and oriented to baseline.  No distressed noted at this time.  All belongings taken with the patient at discharge.   

## 2018-06-30 ENCOUNTER — Other Ambulatory Visit: Payer: Self-pay

## 2018-06-30 ENCOUNTER — Encounter (HOSPITAL_COMMUNITY): Payer: Self-pay | Admitting: Emergency Medicine

## 2018-06-30 ENCOUNTER — Emergency Department (HOSPITAL_COMMUNITY)
Admission: EM | Admit: 2018-06-30 | Discharge: 2018-07-01 | Disposition: A | Discharge status: Home / Self Care | Payer: Self-pay | Attending: Emergency Medicine | Admitting: Emergency Medicine

## 2018-06-30 DIAGNOSIS — R197 Diarrhea, unspecified: Secondary | ICD-10-CM | POA: Insufficient documentation

## 2018-06-30 DIAGNOSIS — F17210 Nicotine dependence, cigarettes, uncomplicated: Secondary | ICD-10-CM | POA: Insufficient documentation

## 2018-06-30 DIAGNOSIS — N3000 Acute cystitis without hematuria: Secondary | ICD-10-CM

## 2018-06-30 DIAGNOSIS — R112 Nausea with vomiting, unspecified: Secondary | ICD-10-CM | POA: Insufficient documentation

## 2018-06-30 DIAGNOSIS — Z79899 Other long term (current) drug therapy: Secondary | ICD-10-CM | POA: Insufficient documentation

## 2018-06-30 DIAGNOSIS — N3090 Cystitis, unspecified without hematuria: Secondary | ICD-10-CM | POA: Insufficient documentation

## 2018-06-30 DIAGNOSIS — J45909 Unspecified asthma, uncomplicated: Secondary | ICD-10-CM | POA: Insufficient documentation

## 2018-06-30 DIAGNOSIS — R51 Headache: Secondary | ICD-10-CM | POA: Insufficient documentation

## 2018-06-30 DIAGNOSIS — R300 Dysuria: Secondary | ICD-10-CM | POA: Insufficient documentation

## 2018-06-30 LAB — CBC
HCT: 46.9 % (ref 39.0–52.0)
HEMOGLOBIN: 15.3 g/dL (ref 13.0–17.0)
MCH: 30.1 pg (ref 26.0–34.0)
MCHC: 32.6 g/dL (ref 30.0–36.0)
MCV: 92.1 fL (ref 78.0–100.0)
PLATELETS: 258 10*3/uL (ref 150–400)
RBC: 5.09 MIL/uL (ref 4.22–5.81)
RDW: 11.5 % (ref 11.5–15.5)
WBC: 12 10*3/uL — ABNORMAL HIGH (ref 4.0–10.5)

## 2018-06-30 LAB — COMPREHENSIVE METABOLIC PANEL
ALBUMIN: 4.1 g/dL (ref 3.5–5.0)
ALK PHOS: 84 U/L (ref 38–126)
ALT: 28 U/L (ref 0–44)
AST: 23 U/L (ref 15–41)
Anion gap: 7 (ref 5–15)
BILIRUBIN TOTAL: 0.8 mg/dL (ref 0.3–1.2)
BUN: 10 mg/dL (ref 6–20)
CALCIUM: 9.8 mg/dL (ref 8.9–10.3)
CO2: 26 mmol/L (ref 22–32)
CREATININE: 1.03 mg/dL (ref 0.61–1.24)
Chloride: 106 mmol/L (ref 98–111)
GFR calc Af Amer: >60 mL/min (ref >60)
GFR calc non Af Amer: >60 mL/min (ref >60)
GLUCOSE: 91 mg/dL (ref 70–99)
Potassium: 3.7 mmol/L (ref 3.5–5.1)
SODIUM: 139 mmol/L (ref 135–145)
Total Protein: 7.4 g/dL (ref 6.5–8.1)

## 2018-06-30 LAB — URINALYSIS, ROUTINE W REFLEX MICROSCOPIC
BILIRUBIN URINE: NEGATIVE
Glucose, UA: NEGATIVE mg/dL
Hgb urine dipstick: NEGATIVE
Ketones, ur: NEGATIVE mg/dL
Nitrite: NEGATIVE
PROTEIN: NEGATIVE mg/dL
SPECIFIC GRAVITY, URINE: 1.021 (ref 1.005–1.030)
pH: 6 (ref 5.0–8.0)

## 2018-06-30 LAB — LIPASE, BLOOD: Lipase: 32 U/L (ref 11–51)

## 2018-06-30 MED ORDER — DICYCLOMINE HCL 10 MG PO CAPS
10.0000 mg | ORAL_CAPSULE | Freq: Once | ORAL | Status: AC
  Administered 2018-06-30: 10 mg via ORAL
  Filled 2018-06-30: qty 1

## 2018-06-30 MED ORDER — IBUPROFEN 400 MG PO TABS
400.0000 mg | ORAL_TABLET | Freq: Once | ORAL | Status: AC | PRN
  Administered 2018-06-30: 400 mg via ORAL
  Filled 2018-06-30: qty 1

## 2018-06-30 MED ORDER — ONDANSETRON 4 MG PO TBDP
4.0000 mg | ORAL_TABLET | Freq: Once | ORAL | Status: AC | PRN
  Administered 2018-06-30: 4 mg via ORAL
  Filled 2018-06-30: qty 1

## 2018-06-30 NOTE — ED Triage Notes (Signed)
Pt presents with upper abd pain with n/v/d x 2 days; also headache with pressure behind eyes; pt also c/o burning with urination

## 2018-06-30 NOTE — ED Provider Notes (Signed)
Patient placed in Quick Look pathway, seen and evaluated   Chief Complaint: abd pain  HPI:   32 y.o. male with epigastric abdominal cramping, N/V/D that began 2 days ago.  Patient reports emesis is nonbilious, nonbloody nature.  He reports last episode was this morning.  He denies any fevers.  No sick contacts.  No recent antibiotic use.  No recent travel.  No suspicious foods.  He denies any fever, chills, chest pain, shortness of breath, alcohol use.  He does note some dysuria.  He denies any flank pain.  No previous abdominal surgeries.  ROS: abdominal pain (one)  Physical Exam:   Gen: No distress  Neuro: Awake and Alert  Skin: Warm. No jaundice.     Focused Exam: Appearance normal. Abdomen is soft and non-tender to palpation. No rebound, rigidity or guarding. Bowel sounds are present in all four quadrants. No distension. Negative Murphy's sign.  No McBurney's point tenderness. Negative Rovsing, psoas and obturator sign.  Given no pain. No indication for imaging at this time. No RUQ abd pain and negative murphy's sign.  Initiation of care has begun. The patient has been counseled on the process, plan, and necessity for staying for the completion/evaluation, and the remainder of the medical screening examination    Princella Pellegrini 06/30/18 2024    Gerhard Munch, MD 07/02/18 [phone]

## 2018-07-01 MED ORDER — ONDANSETRON 4 MG PO TBDP: 4.0000 mg | ORAL_TABLET | Freq: Three times a day (TID) | ORAL | 0 refills | Status: DC | PRN

## 2018-07-01 MED ORDER — CIPROFLOXACIN HCL 500 MG PO TABS: 500.0000 mg | ORAL_TABLET | Freq: Two times a day (BID) | ORAL | 0 refills | Status: AC

## 2018-07-01 NOTE — ED Notes (Signed)
Reviewed d/c instructions with pt, who verbalized understanding and had no outstanding questions. Pt departed in NAD, refused use of wheelchair.   

## 2018-07-01 NOTE — Discharge Instructions (Signed)
Thank you for allowing me to care for you today in the Emergency Department.   Take 1 tablet of ciprofloxacin 2 times daily for the next 5 days.  You can also try taking Azo which is available over-the-counter to help with the burning when you pee.  For pain control, take 650 mg of Tylenol every 6 hours.  You can let 1 tablet of Zofran dissolve in your tongue every 8 hours as needed for nausea or vomiting.  Call the number on your discharge paperwork to get established with a primary care provider.  Return to the emergency department if you develop a high fever, if you have persistent vomiting despite taking Zofran, if you have blood in your stool or vomit, or other new, concerning symptoms.

## 2018-07-01 NOTE — ED Notes (Signed)
Pt given ice water for fluid challenge.  

## 2018-07-01 NOTE — ED Provider Notes (Signed)
[facility] EMERGENCY DEPARTMENT Provider Note   CSN: [id number] Arrival date & time: 06/30/18  1851     History   Chief Complaint Chief Complaint  Patient presents with  . Abdominal Pain  . Emesis  . Diarrhea  . Dysuria  . Headache    HPI Eric Stout is a 32 y.o. male with a history of asthma and bronchitis who presents to the emergency department with a chief complaint of abdominal pain.  The patient endorses abdominal pain with associated nausea, vomiting, dysuria, diarrhea for 2 days.  He also reports a headache over the last day that he describes as pressure located behind his bilateral eyes.  No treatment prior to arrival.  He denies fever, chills, constipation, hematuria, penile or testicular pain or swelling, melena, hematochezia, chest pain, shortness of breath, rash.  No known aggravating or alleviating factors for his abdominal pain.  No history of abdominal surgeries.  Last episode of emesis was this morning.  The history is provided by the patient. No language interpreter was used.    Past Medical History:  Diagnosis Date  . Asthma   . Bronchitis     There are no active problems to display for this patient.   History reviewed. No pertinent surgical history.      Home Medications    Prior to Admission medications   Medication Sig Start Date End Date Taking? Authorizing Provider  ciprofloxacin (CIPRO) 500 MG tablet Take 1 tablet (500 mg total) by mouth every 12 (twelve) hours for 5 days. 07/01/18 07/06/18  Gorje Iyer A, PA-C  cyclobenzaprine (FLEXERIL) 10 MG tablet Take 1 tablet (10 mg total) by mouth 2 (two) times daily as needed for muscle spasms. 01/21/18   Curatolo, Adam, DO  methocarbamol (ROBAXIN) 500 MG tablet Take 1 tablet (500 mg total) by mouth 2 (two) times daily. Patient not taking: Reported on 09/09/2016 10/15/14   Santiago Glad, PA-C  naproxen (NAPROSYN) 500 MG tablet Take 1 tablet (500 mg total) by mouth 2 (two) times  daily. Patient not taking: Reported on 09/09/2016 10/15/14   Santiago Glad, PA-C  naproxen (NAPROSYN) 500 MG tablet Take 1 tablet (500 mg total) by mouth 2 (two) times daily. Patient not taking: Reported on 09/09/2016 02/27/15   Harle Battiest, NP  ondansetron (ZOFRAN ODT) 4 MG disintegrating tablet Take 1 tablet (4 mg total) by mouth every 8 (eight) hours as needed for nausea or vomiting. 07/01/18   Matheau Orona A, PA-C  Phenylephrine-DM-GG-APAP 5-10-200-325 MG TABS Take 2 tablets by mouth 3 (three) times daily. Patient not taking: Reported on 09/09/2016 02/27/15   Harle Battiest, NP  predniSONE (DELTASONE) 20 MG tablet Take 2 tablets (40 mg total) by mouth daily. Patient not taking: Reported on 09/09/2016 08/19/16   Arthor Captain, PA-C  traMADol (ULTRAM) 50 MG tablet Take 1 tablet (50 mg total) by mouth every 6 (six) hours as needed. Patient not taking: Reported on 09/09/2016 10/15/14   Santiago Glad, PA-C    Family History History reviewed. No pertinent family history.  Social History Social History   Tobacco Use  . Smoking status: Current Every Day Smoker    Packs/day: 0.25  . Smokeless tobacco: Never Used  Substance Use Topics  . Alcohol use: No    Comment: once in a while.  . Drug use: Yes    Types: Marijuana     Allergies   Peanuts [peanut oil] and Penicillins   Review of Systems Review of Systems  Constitutional: Negative for  appetite change and fever.  Respiratory: Negative for shortness of breath.   Cardiovascular: Negative for chest pain.  Gastrointestinal: Positive for abdominal pain, diarrhea, nausea and vomiting. Negative for blood in stool and constipation.  Genitourinary: Positive for dysuria. Negative for frequency, penile swelling, scrotal swelling and testicular pain.  Musculoskeletal: Negative for back pain.  Skin: Negative for rash.  Allergic/Immunologic: Negative for immunocompromised state.  Neurological: Negative for weakness and  headaches.  Psychiatric/Behavioral: Negative for confusion.     Physical Exam Updated Vital Signs BP 119/79   Pulse 70   Temp 97.9 F (36.6 C) (Oral)   Resp 12   Ht 5\' 11"  (1.803 m)   Wt 81.6 kg   SpO2 94%   BMI 25.10 kg/m   Physical Exam  Constitutional: He appears well-developed.  HENT:  Head: Normocephalic.  Eyes: Conjunctivae are normal.  Neck: Neck supple.  Cardiovascular: Normal rate, regular rhythm, normal heart sounds and intact distal pulses. Exam reveals no gallop and no friction rub.  No murmur heard. Pulmonary/Chest: Effort normal and breath sounds normal. No stridor. No respiratory distress. He has no wheezes. He has no rales. He exhibits no tenderness.  Abdominal: Soft. He exhibits no distension and no mass. There is no tenderness. There is no rebound and no guarding. No hernia.  Abdomen is soft, nontender, nondistended.  Neurological: He is alert.  Skin: Skin is warm and dry.  Psychiatric: His behavior is normal.  Nursing note and vitals reviewed.    ED Treatments / Results  Labs (all labs ordered are listed, but only abnormal results are displayed) Labs Reviewed  CBC - Abnormal; Notable for the following components:      Result Value   WBC 12.0 (*)    All other components within normal limits  URINALYSIS, ROUTINE W REFLEX MICROSCOPIC - Abnormal; Notable for the following components:   APPearance HAZY (*)    Leukocytes, UA SMALL (*)    Bacteria, UA RARE (*)    All other components within normal limits  URINE CULTURE  LIPASE, BLOOD  COMPREHENSIVE METABOLIC PANEL    EKG None  Radiology No results found.  Procedures Procedures (including critical care time)  Medications Ordered in ED Medications  ondansetron (ZOFRAN-ODT) disintegrating tablet 4 mg (4 mg Oral Given 06/30/18 2024)  ibuprofen (ADVIL,MOTRIN) tablet 400 mg (400 mg Oral Given 06/30/18 2024)  dicyclomine (BENTYL) capsule 10 mg (10 mg Oral Given 06/30/18 2024)     Initial  Impression / Assessment and Plan / ED Course  I have reviewed the triage vital signs and the nursing notes.  Pertinent labs & imaging results that were available during my care of the patient were reviewed by me and considered in my medical decision making (see chart for details).    Patient is nontoxic, nonseptic appearing, in no apparent distress.  Patient's pain and other symptoms adequately managed in emergency department.  Fluid bolus given.  Labs, imaging and vitals reviewed.  Patient does not meet the SIRS or Sepsis criteria.  On repeat exam patient does not have a surgical abdomen and there are no peritoneal signs.  No indication of appendicitis, bowel obstruction, bowel perforation, cholecystitis, diverticulitis.  Given mild leukocytosis of 12.0 with UA that appears somewhat concerning for infection, will treat for UTI.  Urine culture has been sent.  Patient discharged home with symptomatic treatment and given strict instructions for follow-up with their primary care physician.  I have also discussed reasons to return immediately to the ER.  Patient expresses  understanding and agrees with plan.   Final Clinical Impressions(s) / ED Diagnoses   Final diagnoses:  Acute cystitis without hematuria  Non-intractable vomiting with nausea, unspecified vomiting type    ED Discharge Orders         Ordered    ondansetron (ZOFRAN ODT) 4 MG disintegrating tablet  Every 8 hours PRN     07/01/18 0147    ciprofloxacin (CIPRO) 500 MG tablet  Every 12 hours     07/01/18 0147           Tangelia Sanson A, PA-C 07/01/18 0901    Geoffery Lyons, MD 07/04/18 0981

## 2018-07-02 LAB — URINE CULTURE: SPECIAL REQUESTS: NORMAL

## 2018-07-10 ENCOUNTER — Emergency Department (HOSPITAL_COMMUNITY): Payer: Self-pay

## 2018-07-10 ENCOUNTER — Other Ambulatory Visit: Payer: Self-pay

## 2018-07-10 ENCOUNTER — Emergency Department (HOSPITAL_COMMUNITY)
Admission: EM | Admit: 2018-07-10 | Discharge: 2018-07-10 | Disposition: A | Discharge status: Home / Self Care | Payer: Self-pay | Attending: Emergency Medicine | Admitting: Emergency Medicine

## 2018-07-10 ENCOUNTER — Encounter (HOSPITAL_COMMUNITY): Payer: Self-pay | Admitting: *Deleted

## 2018-07-10 DIAGNOSIS — F17200 Nicotine dependence, unspecified, uncomplicated: Secondary | ICD-10-CM | POA: Insufficient documentation

## 2018-07-10 DIAGNOSIS — R109 Unspecified abdominal pain: Secondary | ICD-10-CM | POA: Insufficient documentation

## 2018-07-10 DIAGNOSIS — J45909 Unspecified asthma, uncomplicated: Secondary | ICD-10-CM | POA: Insufficient documentation

## 2018-07-10 DIAGNOSIS — R112 Nausea with vomiting, unspecified: Secondary | ICD-10-CM | POA: Insufficient documentation

## 2018-07-10 LAB — COMPREHENSIVE METABOLIC PANEL
ALT: 20 U/L (ref 0–44)
ANION GAP: 8 (ref 5–15)
AST: 21 U/L (ref 15–41)
Albumin: 3.8 g/dL (ref 3.5–5.0)
Alkaline Phosphatase: 76 U/L (ref 38–126)
BUN: 8 mg/dL (ref 6–20)
CHLORIDE: 103 mmol/L (ref 98–111)
CO2: 27 mmol/L (ref 22–32)
Calcium: 9.2 mg/dL (ref 8.9–10.3)
Creatinine, Ser: 1.19 mg/dL (ref 0.61–1.24)
GFR calc Af Amer: >60 mL/min (ref >60)
GLUCOSE: 100 mg/dL — AB (ref 70–99)
POTASSIUM: 3.5 mmol/L (ref 3.5–5.1)
SODIUM: 138 mmol/L (ref 135–145)
TOTAL PROTEIN: 6.7 g/dL (ref 6.5–8.1)
Total Bilirubin: 0.4 mg/dL (ref 0.3–1.2)

## 2018-07-10 LAB — CBC WITH DIFFERENTIAL/PLATELET
ABS IMMATURE GRANULOCYTES: 0.1 10*3/uL (ref 0.0–0.1)
BASOS ABS: 0.1 10*3/uL (ref 0.0–0.1)
Basophils Relative: 1 %
Eosinophils Absolute: 0.4 10*3/uL (ref 0.0–0.7)
Eosinophils Relative: 4 %
HCT: 44.5 % (ref 39.0–52.0)
Hemoglobin: 14.3 g/dL (ref 13.0–17.0)
Immature Granulocytes: 1 %
LYMPHS PCT: 37 %
Lymphs Abs: 3.7 10*3/uL (ref 0.7–4.0)
MCH: 30 pg (ref 26.0–34.0)
MCHC: 32.1 g/dL (ref 30.0–36.0)
MCV: 93.5 fL (ref 78.0–100.0)
Monocytes Absolute: 0.9 10*3/uL (ref 0.1–1.0)
Monocytes Relative: 9 %
NEUTROS ABS: 4.9 10*3/uL (ref 1.7–7.7)
Neutrophils Relative %: 48 %
Platelets: 224 10*3/uL (ref 150–400)
RBC: 4.76 MIL/uL (ref 4.22–5.81)
RDW: 11.3 % — ABNORMAL LOW (ref 11.5–15.5)
WBC: 10.1 10*3/uL (ref 4.0–10.5)

## 2018-07-10 LAB — URINALYSIS, ROUTINE W REFLEX MICROSCOPIC
BILIRUBIN URINE: NEGATIVE
Glucose, UA: NEGATIVE mg/dL
HGB URINE DIPSTICK: NEGATIVE
Ketones, ur: NEGATIVE mg/dL
Leukocytes, UA: NEGATIVE
Nitrite: NEGATIVE
PROTEIN: NEGATIVE mg/dL
Specific Gravity, Urine: 1.023 (ref 1.005–1.030)
pH: 5 (ref 5.0–8.0)

## 2018-07-10 MED ORDER — MELOXICAM 15 MG PO TABS: 15.0000 mg | ORAL_TABLET | Freq: Every day | ORAL | 0 refills | Status: DC

## 2018-07-10 MED ORDER — KETOROLAC TROMETHAMINE 30 MG/ML IJ SOLN
30.0000 mg | Freq: Once | INTRAMUSCULAR | Status: AC
  Administered 2018-07-10: 30 mg via INTRAVENOUS
  Filled 2018-07-10: qty 1

## 2018-07-10 MED ORDER — SODIUM CHLORIDE 0.9 % IV BOLUS
1000.0000 mL | Freq: Once | INTRAVENOUS | Status: AC
  Administered 2018-07-10: 1000 mL via INTRAVENOUS

## 2018-07-10 MED ORDER — ONDANSETRON HCL 4 MG/2ML IJ SOLN
4.0000 mg | Freq: Once | INTRAMUSCULAR | Status: AC
  Administered 2018-07-10: 4 mg via INTRAVENOUS
  Filled 2018-07-10: qty 2

## 2018-07-10 MED ORDER — CYCLOBENZAPRINE HCL 10 MG PO TABS: 10.0000 mg | ORAL_TABLET | Freq: Three times a day (TID) | ORAL | 0 refills | Status: DC | PRN

## 2018-07-10 NOTE — ED Notes (Signed)
Nurse drawing labs. 

## 2018-07-10 NOTE — ED Provider Notes (Signed)
[facility] EMERGENCY DEPARTMENT Provider Note   CSN: [id number] Arrival date & time: 07/10/18  0152     History   Chief Complaint Chief Complaint  Patient presents with  . Flank Pain    HPI Eric Stout is a 32 y.o. male.  The history is provided by the patient.  Flank Pain   He has a history of asthma, and comes in complaining of left flank pain.  He has been having intermittent left flank pain for several months.  This episode started yesterday.  Pain is dull and worse with certain movements.  There was associated nausea and vomiting.  He denies any urinary urgency, frequency, tenesmus, dysuria.  He denies fever or chills.  He currently rates pain at 9/10.  He did take ibuprofen at home with little relief.  Of note, he was treated for urinary tract infection about a week ago.  Past Medical History:  Diagnosis Date  . Asthma   . Bronchitis     There are no active problems to display for this patient.   History reviewed. No pertinent surgical history.      Home Medications    Prior to Admission medications   Medication Sig Start Date End Date Taking? Authorizing Provider  cyclobenzaprine (FLEXERIL) 10 MG tablet Take 1 tablet (10 mg total) by mouth 2 (two) times daily as needed for muscle spasms. Patient not taking: Reported on 07/10/2018 01/21/18   Virgina Norfolk, DO  methocarbamol (ROBAXIN) 500 MG tablet Take 1 tablet (500 mg total) by mouth 2 (two) times daily. Patient not taking: Reported on 09/09/2016 10/15/14   Santiago Glad, PA-C  naproxen (NAPROSYN) 500 MG tablet Take 1 tablet (500 mg total) by mouth 2 (two) times daily. Patient not taking: Reported on 09/09/2016 10/15/14   Santiago Glad, PA-C  naproxen (NAPROSYN) 500 MG tablet Take 1 tablet (500 mg total) by mouth 2 (two) times daily. Patient not taking: Reported on 09/09/2016 02/27/15   Harle Battiest, NP  ondansetron (ZOFRAN ODT) 4 MG disintegrating tablet Take 1 tablet (4 mg total)  by mouth every 8 (eight) hours as needed for nausea or vomiting. Patient not taking: Reported on 07/10/2018 07/01/18   McDonald, Mia A, PA-C  Phenylephrine-DM-GG-APAP 5-10-200-325 MG TABS Take 2 tablets by mouth 3 (three) times daily. Patient not taking: Reported on 09/09/2016 02/27/15   Harle Battiest, NP  predniSONE (DELTASONE) 20 MG tablet Take 2 tablets (40 mg total) by mouth daily. Patient not taking: Reported on 09/09/2016 08/19/16   Arthor Captain, PA-C  traMADol (ULTRAM) 50 MG tablet Take 1 tablet (50 mg total) by mouth every 6 (six) hours as needed. Patient not taking: Reported on 09/09/2016 10/15/14   Santiago Glad, PA-C    Family History No family history on file.  Social History Social History   Tobacco Use  . Smoking status: Current Every Day Smoker    Packs/day: 0.25  . Smokeless tobacco: Never Used  Substance Use Topics  . Alcohol use: No    Comment: once in a while.  . Drug use: Yes    Types: Marijuana     Allergies   Lactose intolerance (gi); Peanuts [peanut oil]; and Penicillins   Review of Systems Review of Systems  Genitourinary: Positive for flank pain.  All other systems reviewed and are negative.    Physical Exam Updated Vital Signs BP 118/72 (BP Location: Right Arm)   Pulse 66   Temp 98.4 F (36.9 C) (Oral)   Resp 12  SpO2 100%   Physical Exam  Nursing note and vitals reviewed.  32 year old male, resting comfortably and in no acute distress. Vital signs are normal. Oxygen saturation is 100%, which is normal. Head is normocephalic and atraumatic. PERRLA, EOMI. Oropharynx is clear. Neck is nontender and supple without adenopathy or JVD. Back is nontender in the midline.  There is mild left CVA tenderness. Lungs are clear without rales, wheezes, or rhonchi. Chest is nontender. Heart has regular rate and rhythm without murmur. Abdomen is soft, flat, nontender without masses or hepatosplenomegaly and peristalsis is  normoactive. Extremities have no cyanosis or edema, full range of motion is present. Skin is warm and dry without rash. Neurologic: Mental status is normal, cranial nerves are intact, there are no motor or sensory deficits.  ED Treatments / Results  Labs (all labs ordered are listed, but only abnormal results are displayed) Labs Reviewed  URINALYSIS, ROUTINE W REFLEX MICROSCOPIC - Abnormal; Notable for the following components:      Result Value   APPearance HAZY (*)    All other components within normal limits  COMPREHENSIVE METABOLIC PANEL - Abnormal; Notable for the following components:   Glucose, Bld 100 (*)    All other components within normal limits  CBC WITH DIFFERENTIAL/PLATELET - Abnormal; Notable for the following components:   RDW 11.3 (*)    All other components within normal limits    Radiology Ct Renal Stone Study  Result Date: 07/10/2018 CLINICAL DATA:  Left side abdominal pain.  Nausea, vomiting EXAM: CT ABDOMEN AND PELVIS WITHOUT CONTRAST TECHNIQUE: Multidetector CT imaging of the abdomen and pelvis was performed following the standard protocol without IV contrast. COMPARISON:  None. FINDINGS: Lower chest: Lung bases are clear. No effusions. Heart is normal size. Hepatobiliary: No focal hepatic abnormality. Gallbladder unremarkable. Pancreas: No focal abnormality or ductal dilatation. Spleen: No focal abnormality.  Normal size. Adrenals/Urinary Tract: No adrenal abnormality. No focal renal abnormality. No stones or hydronephrosis. Urinary bladder is unremarkable. Stomach/Bowel: Normal appendix. Stomach, large and small bowel grossly unremarkable. Vascular/Lymphatic: No evidence of aneurysm or adenopathy. Reproductive: No visible focal abnormality. Other: No free fluid or free air. Musculoskeletal: No acute bony abnormality. IMPRESSION: No renal or ureteral stones.  No hydronephrosis. Normal appendix. No acute findings in the abdomen or pelvis. Electronically Signed   By:  Charlett Nose M.D.   On: 07/10/2018 07:10    Procedures Procedures  Medications Ordered in ED Medications  ondansetron (ZOFRAN) injection 4 mg (has no administration in time range)  sodium chloride 0.9 % bolus 1,000 mL (has no administration in time range)  ketorolac (TORADOL) 30 MG/ML injection 30 mg (has no administration in time range)     Initial Impression / Assessment and Plan / ED Course  I have reviewed the triage vital signs and the nursing notes.  Pertinent labs & imaging results that were available during my care of the patient were reviewed by me and considered in my medical decision making (see chart for details).  Left flank pain of uncertain cause.  Old records reviewed confirming ED visit for UTI on September 26.  Culture did not grow a predominant organism and was felt to be contaminated.  Urinalysis at that time did not have significant hematuria.  We will recheck urinalysis and check screening labs.  He will be given IV fluids, ondansetron, ketorolac.  Of note, no prior abdominal imaging studies on file.  He had good relief of pain with above-noted treatment.  Urinalysis is normal  as his metabolic panel and CBC.  However, given that he has recurrent symptoms, I felt it was worthwhile to rule out urolithiasis.  He was sent for renal stone protocol CT scan showing no evidence of nephrolithiasis.  He is discharged with prescriptions for meloxicam and cyclobenzaprine.  Final Clinical Impressions(s) / ED Diagnoses   Final diagnoses:  Left flank pain    ED Discharge Orders         Ordered    cyclobenzaprine (FLEXERIL) 10 MG tablet  3 times daily PRN     07/10/18 0728    meloxicam (MOBIC) 15 MG tablet  Daily     07/10/18 0728           Dione Booze, MD 07/10/18 [phone]

## 2018-07-10 NOTE — ED Notes (Signed)
Patient transported to CT 

## 2018-07-10 NOTE — Discharge Instructions (Addendum)
Apply ice for 30 minutes, four times a day.  Take acetaminophen as needed for pain relief.

## 2018-07-10 NOTE — ED Triage Notes (Signed)
Pt c/o L flank pain while at work yesterday. Denies NVD, no injury. Denies urinary issues.

## 2018-10-16 ENCOUNTER — Other Ambulatory Visit: Payer: Self-pay

## 2018-10-16 ENCOUNTER — Encounter (HOSPITAL_COMMUNITY): Payer: Self-pay | Admitting: Emergency Medicine

## 2018-10-16 ENCOUNTER — Emergency Department (HOSPITAL_COMMUNITY)
Admission: EM | Admit: 2018-10-16 | Discharge: 2018-10-16 | Disposition: A | Discharge status: Home / Self Care | Payer: Self-pay | Attending: Emergency Medicine | Admitting: Emergency Medicine

## 2018-10-16 DIAGNOSIS — Z79899 Other long term (current) drug therapy: Secondary | ICD-10-CM | POA: Insufficient documentation

## 2018-10-16 DIAGNOSIS — J45909 Unspecified asthma, uncomplicated: Secondary | ICD-10-CM | POA: Insufficient documentation

## 2018-10-16 DIAGNOSIS — Z202 Contact with and (suspected) exposure to infections with a predominantly sexual mode of transmission: Secondary | ICD-10-CM | POA: Insufficient documentation

## 2018-10-16 DIAGNOSIS — F17200 Nicotine dependence, unspecified, uncomplicated: Secondary | ICD-10-CM | POA: Insufficient documentation

## 2018-10-16 DIAGNOSIS — R1030 Lower abdominal pain, unspecified: Secondary | ICD-10-CM | POA: Insufficient documentation

## 2018-10-16 DIAGNOSIS — Z91010 Allergy to peanuts: Secondary | ICD-10-CM | POA: Insufficient documentation

## 2018-10-16 LAB — URINALYSIS, ROUTINE W REFLEX MICROSCOPIC
Bilirubin Urine: NEGATIVE
Glucose, UA: NEGATIVE mg/dL
Hgb urine dipstick: NEGATIVE
Ketones, ur: NEGATIVE mg/dL
Leukocytes, UA: NEGATIVE
Nitrite: NEGATIVE
Protein, ur: NEGATIVE mg/dL
Specific Gravity, Urine: 1.023 (ref 1.005–1.030)
pH: 6 (ref 5.0–8.0)

## 2018-10-16 MED ORDER — CEFTRIAXONE SODIUM 250 MG IJ SOLR
250.0000 mg | Freq: Once | INTRAMUSCULAR | Status: AC
  Administered 2018-10-16: 250 mg via INTRAMUSCULAR
  Filled 2018-10-16: qty 250

## 2018-10-16 MED ORDER — AZITHROMYCIN 250 MG PO TABS
1000.0000 mg | ORAL_TABLET | Freq: Once | ORAL | Status: AC
  Administered 2018-10-16: 1000 mg via ORAL
  Filled 2018-10-16: qty 4

## 2018-10-16 MED ORDER — STERILE WATER FOR INJECTION IJ SOLN
INTRAMUSCULAR | Status: AC
  Administered 2018-10-16: 10 mL
  Filled 2018-10-16: qty 10
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
## 2018-10-16 NOTE — Discharge Instructions (Addendum)
Follow up with [facility] Department if you develop any signs or symptoms of STD.
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
## 2018-10-16 NOTE — ED Notes (Signed)
Patient verbalizes understanding of discharge instructions. Opportunity for questioning and answers were provided. Armband removed by staff, pt discharged from ED home via POV.  

## 2018-10-16 NOTE — ED Notes (Signed)
Pt reports no abd pains.

## 2018-10-25 ENCOUNTER — Emergency Department (HOSPITAL_COMMUNITY)
Admission: EM | Admit: 2018-10-25 | Discharge: 2018-10-25 | Disposition: A | Discharge status: Home / Self Care | Payer: Self-pay | Attending: Emergency Medicine | Admitting: Emergency Medicine

## 2018-10-25 ENCOUNTER — Other Ambulatory Visit: Payer: Self-pay

## 2018-10-25 ENCOUNTER — Encounter (HOSPITAL_COMMUNITY): Payer: Self-pay | Admitting: Emergency Medicine

## 2018-10-25 DIAGNOSIS — Z79899 Other long term (current) drug therapy: Secondary | ICD-10-CM | POA: Insufficient documentation

## 2018-10-25 DIAGNOSIS — J069 Acute upper respiratory infection, unspecified: Secondary | ICD-10-CM | POA: Insufficient documentation

## 2018-10-25 DIAGNOSIS — R52 Pain, unspecified: Secondary | ICD-10-CM

## 2018-10-25 DIAGNOSIS — B9789 Other viral agents as the cause of diseases classified elsewhere: Secondary | ICD-10-CM

## 2018-10-25 DIAGNOSIS — F17210 Nicotine dependence, cigarettes, uncomplicated: Secondary | ICD-10-CM | POA: Insufficient documentation

## 2018-10-25 DIAGNOSIS — J45909 Unspecified asthma, uncomplicated: Secondary | ICD-10-CM | POA: Insufficient documentation

## 2018-10-25 DIAGNOSIS — M791 Myalgia, unspecified site: Secondary | ICD-10-CM | POA: Insufficient documentation

## 2018-10-25 MED ORDER — BENZONATATE 100 MG PO CAPS: 100.0000 mg | ORAL_CAPSULE | Freq: Three times a day (TID) | ORAL | 0 refills | Status: AC

## 2018-10-25 MED ORDER — IBUPROFEN 400 MG PO TABS
600.0000 mg | ORAL_TABLET | Freq: Once | ORAL | Status: AC
  Administered 2018-10-25: 600 mg via ORAL
  Filled 2018-10-25: qty 1

## 2018-10-25 NOTE — ED Provider Notes (Signed)
[facility] EMERGENCY DEPARTMENT Provider Note   CSN: [id number] Arrival date & time: 10/25/18  1407     History   Chief Complaint Chief Complaint  Patient presents with  . Generalized Body Aches    HPI Eric Stout is a 33 y.o. male with a PMH of Asthma and Bronchitis presenting with generalized body aches, intermittent dry cough, congestion, and rhinorrhea onset last night. Patient reports sick contacts at home including his mother and child with influenza. Patient states he took ibuprofen with relief. Patient reports an intermittent dull headache, but denies a headache currently. Patient denies vision changes, weakness, numbness, or neck pain. Patient states he has not had an exacerbation of asthma in years. Patient denies nausea, vomiting, diarrhea, abdominal pain, shortness of breath, or fever.   HPI  Past Medical History:  Diagnosis Date  . Asthma   . Bronchitis     There are no active problems to display for this patient.   History reviewed. No pertinent surgical history.      Home Medications    Prior to Admission medications   Medication Sig Start Date End Date Taking? Authorizing Provider  benzonatate (TESSALON) 100 MG capsule Take 1 capsule (100 mg total) by mouth every 8 (eight) hours. 10/25/18   Carlyle Basques P, PA-C  cyclobenzaprine (FLEXERIL) 10 MG tablet Take 1 tablet (10 mg total) by mouth 3 (three) times daily as needed for muscle spasms. 07/10/18   Dione Booze, MD  meloxicam (MOBIC) 15 MG tablet Take 1 tablet (15 mg total) by mouth daily. 07/10/18   Dione Booze, MD  Phenylephrine-DM-GG-APAP 5-10-200-325 MG TABS Take 2 tablets by mouth 3 (three) times daily. Patient not taking: Reported on 09/09/2016 02/27/15   Harle Battiest, NP    Family History No family history on file.  Social History Social History   Tobacco Use  . Smoking status: Current Every Day Smoker    Packs/day: 0.25  . Smokeless tobacco: Never Used    Substance Use Topics  . Alcohol use: No    Comment: once in a while.  . Drug use: Yes    Types: Marijuana     Allergies   Lactose intolerance (gi); Peanuts [peanut oil]; and Penicillins   Review of Systems Review of Systems  Constitutional: Positive for chills and diaphoresis. Negative for fatigue, fever and unexpected weight change.  HENT: Positive for congestion and rhinorrhea. Negative for sore throat and trouble swallowing.   Eyes: Negative for visual disturbance.  Respiratory: Positive for cough. Negative for chest tightness, shortness of breath, wheezing and stridor.   Cardiovascular: Negative for chest pain, palpitations and leg swelling.  Gastrointestinal: Negative for abdominal pain, nausea and vomiting.  Endocrine: Negative for cold intolerance and heat intolerance.  Musculoskeletal: Positive for myalgias.  Skin: Negative for pallor and rash.  Allergic/Immunologic: Negative for environmental allergies, food allergies and immunocompromised state.  Neurological: Positive for headaches. Negative for dizziness, syncope, speech difficulty, weakness, light-headedness and numbness.  Psychiatric/Behavioral: The patient is not nervous/anxious.      Physical Exam Updated Vital Signs BP 140/87 (BP Location: Right Arm)   Pulse 97   Temp 98.5 F (36.9 C) (Oral)   Resp 17   SpO2 98%   Physical Exam Vitals signs and nursing note reviewed.  Constitutional:      General: He is not in acute distress.    Appearance: He is well-developed. He is not diaphoretic.  HENT:     Head: Normocephalic and atraumatic.  Right Ear: Tympanic membrane and external ear normal. No middle ear effusion.     Left Ear: Tympanic membrane and external ear normal.  No middle ear effusion.     Nose: Mucosal edema, congestion and rhinorrhea present.     Mouth/Throat:     Mouth: Mucous membranes are moist.     Pharynx: Uvula midline. No oropharyngeal exudate or posterior oropharyngeal erythema.   Eyes:     General:        Right eye: No discharge.        Left eye: No discharge.     Extraocular Movements: Extraocular movements intact.     Conjunctiva/sclera: Conjunctivae normal.     Pupils: Pupils are equal, round, and reactive to light.  Neck:     Musculoskeletal: Normal range of motion and neck supple.  Cardiovascular:     Rate and Rhythm: Normal rate and regular rhythm.     Heart sounds: Normal heart sounds. No murmur. No friction rub. No gallop.   Pulmonary:     Effort: Pulmonary effort is normal. No respiratory distress.     Breath sounds: Normal breath sounds. No wheezing or rales.  Abdominal:     Palpations: Abdomen is soft.     Tenderness: There is no abdominal tenderness.  Musculoskeletal: Normal range of motion.  Lymphadenopathy:     Cervical: No cervical adenopathy.  Skin:    General: Skin is warm.     Findings: No rash.  Neurological:     Mental Status: He is alert and oriented to person, place, and time.     ED Treatments / Results  Labs (all labs ordered are listed, but only abnormal results are displayed) Labs Reviewed - No data to display  EKG None  Radiology No results found.  Procedures Procedures (including critical care time)  Medications Ordered in ED Medications  ibuprofen (ADVIL,MOTRIN) tablet 600 mg (600 mg Oral Given 10/25/18 1450)     Initial Impression / Assessment and Plan / ED Course  I have reviewed the triage vital signs and the nursing notes.  Pertinent labs & imaging results that were available during my care of the patient were reviewed by me and considered in my medical decision making (see chart for details).    Patients symptoms are consistent with URI, likely viral etiology. Discussed testing for influenza due to history of asthma and sick exposures with influenza. Patient requests not to be tested for influenza or started on Tamiflu at this time. Patient states he will return if symptoms worsen. Discussed that  antibiotics are not indicated for viral infections. Provided ibuprofen in the ER. Pt will be discharged with symptomatic treatment.  Verbalizes understanding and is agreeable with plan. Pt is hemodynamically stable & in NAD prior to dc.  Final Clinical Impressions(s) / ED Diagnoses   Final diagnoses:  Viral URI with cough  Generalized body aches    ED Discharge Orders         Ordered    benzonatate (TESSALON) 100 MG capsule  Every 8 hours     10/25/18 1525           Carlyle Basques [place], [place] 10/25/18 1529    Tilden Fossa, MD 10/25/18 1924

## 2018-10-25 NOTE — ED Triage Notes (Signed)
Pt reports having generalized body aches, reports his child has the flu. Denies fever.

## 2018-10-25 NOTE — Discharge Instructions (Signed)
You have been seen today for body aches, cough, and congestion. Please read and follow all provided instructions.   1. Medications: Tessalon for cough, ibuprofen/tylenol for body aches, usual home medications 2. Treatment: rest, drink plenty of fluids 3. Follow Up: Please follow up with your primary doctor in 3-5 days for discussion of your diagnoses and further evaluation after today's visit; if you do not have a primary care doctor use the resource guide provided to find one; Please return to the ER for any new or worsening symptoms. Please obtain all of your results from medical records or have your doctors office obtain the results - share them with your doctor - you should be seen at your doctors office. Call today to arrange your follow up.   Take medications as prescribed. Please review all of the medicines and only take them if you do not have an allergy to them. Return to the emergency room for worsening condition or new concerning symptoms. Follow up with your regular doctor. If you don't have a regular doctor use one of the numbers below to establish a primary care doctor.  Please be aware that if you are taking birth control pills, taking other prescriptions, ESPECIALLY ANTIBIOTICS may make the birth control ineffective - if this is the case, either do not engage in sexual activity or use alternative methods of birth control such as condoms until you have finished the medicine and your family doctor says it is OK to restart them. If you are on a blood thinner such as COUMADIN, be aware that any other medicine that you take may cause the coumadin to either work too much, or not enough - you should have your coumadin level rechecked in next 7 days if this is the case.  ?  It is also a possibility that you have an allergic reaction to any of the medicines that you have been prescribed - Everybody reacts differently to medications and while MOST people have no trouble with most medicines, you may  have a reaction such as nausea, vomiting, rash, swelling, shortness of breath. If this is the case, please stop taking the medicine immediately and contact your physician.  ?  You should return to the ER if you develop severe or worsening symptoms.   Emergency Department Resource Guide 1) Find a Doctor and Pay Out of Pocket Although you won't have to find out who is covered by your insurance plan, it is a good idea to ask around and get recommendations. You will then need to call the office and see if the doctor you have chosen will accept you as a new patient and what types of options they offer for patients who are self-pay. Some doctors offer discounts or will set up payment plans for their patients who do not have insurance, but you will need to ask so you aren't surprised when you get to your appointment.  2) Contact Your Local Health Department Not all health departments have doctors that can see patients for sick visits, but many do, so it is worth a call to see if yours does. If you don't know where your local health department is, you can check in your phone book. The CDC also has a tool to help you locate your state's health department, and many state websites also have listings of all of their local health departments.  3) Find a Walk-in Clinic If your illness is not likely to be very severe or complicated, you may want to try  a walk in clinic. These are popping up all over the country in pharmacies, drugstores, and shopping centers. They're usually staffed by nurse practitioners or physician assistants that have been trained to treat common illnesses and complaints. They're usually fairly quick and inexpensive. However, if you have serious medical issues or chronic medical problems, these are probably not your best option.  No Primary Care Doctor: Call Health Connect at  [phone] - they can help you locate a primary care doctor that  accepts your insurance, provides certain services,  etc. Physician Referral [phone]  Emergency Department Resource Guide 1) Find a Doctor and Pay Out of Pocket Although you won't have to find out who is covered by your insurance plan, it is a good idea to ask around and get recommendations. You will then need to call the office and see if the doctor you have chosen will accept you as a new patient and what types of options they offer for patients who are self-pay. Some doctors offer discounts or will set up payment plans for their patients who do not have insurance, but you will need to ask so you aren't surprised when you get to your appointment.  2) Contact Your Local Health Department Not all health departments have doctors that can see patients for sick visits, but many do, so it is worth a call to see if yours does. If you don't know where your local health department is, you can check in your phone book. The CDC also has a tool to help you locate your state's health department, and many state websites also have listings of all of their local health departments.  3) Find a Walk-in Clinic If your illness is not likely to be very severe or complicated, you may want to try a walk in clinic. These are popping up all over the country in pharmacies, drugstores, and shopping centers. They're usually staffed by nurse practitioners or physician assistants that have been trained to treat common illnesses and complaints. They're usually fairly quick and inexpensive. However, if you have serious medical issues or chronic medical problems, these are probably not your best option.  No Primary Care Doctor: Call Health Connect at  [phone] - they can help you locate a primary care doctor that  accepts your insurance, provides certain services, etc. Physician Referral Service- [phone]  Chronic Pain Problems: Organization         Address  Phone   Notes  [facility]  [phone] Patients need to be referred by their  primary care doctor.   Medication Assistance: Organization         Address  Phone   Notes  [facility] Medication [facility] [address] [place], [place] [address] [phone] --Must be a resident of [facility] -- Must have NO insurance coverage whatsoever (no Medicaid/ Medicare, etc.) -- The pt. MUST have a primary care doctor that directs their care regularly and follows them in the community   [facility]  [phone]   [employer]  [phone]    Agencies that provide inexpensive medical care: Organization         Address  Phone   Notes  [facility]  [phone]   [facility]    [phone]   [facility] [address] [place], [place] [address] [phone]   [facility] [address] [place], [place] [phone]  [facility]    [phone]   [facility]    [phone]   [facility] [address], [place] Phone:  [phone], Fax:  [phone] Hours of Operation:  9 am - 6 pm, M-F.  Also accepts Medicaid/Medicare and self-pay.  [facility] for [place] [address], [address], [place] Phone: [phone], Fax: [phone]. Hours of Operation:  8:30 am - 5:30 pm, M-F.  Also accepts Medicaid and self-pay.  [facility] [place] [address], [place] Phone: [phone]   [place], [place], [place] [phone], Ext. [phone] Mondays & Thursdays: 7-9 AM.  First 15 patients are seen on a first come, first serve basis.    [place] Providers:  Organization         Address  Phone   Notes  [facility] [address],  [phone] Also accepts self-pay patients.  [facility] [address], [place]  [phone]   [place], [address], [place] [phone]   [facility] [address] [place] [phone]   Lucianne Lei [address], [place]   [phone] Only accepts [place] patients after they have their name applied to their card.   Self-Pay (no insurance) in [facility]:  Organization         Address  Phone   Notes  Sickle Cell Patients, [facility] [place] [phone]   [facility] [place] [phone]   [facility] [place]  [place], [place], [place] [phone]   [facility]/Dr. Osei-Bonsu  [address] [place] or [address], [place] [phone] Phone number for both [place] and [place] locations is the same.  [facility]gent [facility] and [facility] [address], [place] [phone]   [facility] [address] [place] or [address] [phone]   [facility] [address], [place] [phone], phone; [phone], fax Sees patients 1st and 3rd Saturday of every month.  Must not qualify for public or private insurance (i.e. Medicaid, Medicare, [place] [employer] Choice, Veterans' Benefits)  Household income should be no more than 200% of the poverty level The clinic cannot treat you if you are pregnant or think you are pregnant  Sexually transmitted diseases are not treated at the clinic.

## 2019-01-06 ENCOUNTER — Other Ambulatory Visit: Payer: Self-pay

## 2019-01-06 ENCOUNTER — Emergency Department (HOSPITAL_COMMUNITY)
Admission: EM | Admit: 2019-01-06 | Discharge: 2019-01-06 | Disposition: A | Discharge status: Home / Self Care | Payer: Self-pay | Attending: Emergency Medicine | Admitting: Emergency Medicine

## 2019-01-06 ENCOUNTER — Emergency Department (HOSPITAL_COMMUNITY): Payer: Self-pay

## 2019-01-06 DIAGNOSIS — Z91010 Allergy to peanuts: Secondary | ICD-10-CM | POA: Insufficient documentation

## 2019-01-06 DIAGNOSIS — F17200 Nicotine dependence, unspecified, uncomplicated: Secondary | ICD-10-CM | POA: Insufficient documentation

## 2019-01-06 DIAGNOSIS — F1290 Cannabis use, unspecified, uncomplicated: Secondary | ICD-10-CM

## 2019-01-06 DIAGNOSIS — Z79899 Other long term (current) drug therapy: Secondary | ICD-10-CM | POA: Insufficient documentation

## 2019-01-06 DIAGNOSIS — F12188 Cannabis abuse with other cannabis-induced disorder: Secondary | ICD-10-CM | POA: Insufficient documentation

## 2019-01-06 DIAGNOSIS — R1084 Generalized abdominal pain: Secondary | ICD-10-CM | POA: Insufficient documentation

## 2019-01-06 DIAGNOSIS — J45909 Unspecified asthma, uncomplicated: Secondary | ICD-10-CM | POA: Insufficient documentation

## 2019-01-06 DIAGNOSIS — F12988 Cannabis use, unspecified with other cannabis-induced disorder: Secondary | ICD-10-CM

## 2019-01-06 LAB — COMPREHENSIVE METABOLIC PANEL
ALT: 33 U/L (ref 0–44)
AST: 26 U/L (ref 15–41)
Albumin: 4.2 g/dL (ref 3.5–5.0)
Alkaline Phosphatase: 99 U/L (ref 38–126)
Anion gap: 10 (ref 5–15)
BUN: 13 mg/dL (ref 6–20)
CO2: 26 mmol/L (ref 22–32)
Calcium: 9.4 mg/dL (ref 8.9–10.3)
Chloride: 101 mmol/L (ref 98–111)
Creatinine, Ser: 1.13 mg/dL (ref 0.61–1.24)
GFR calc Af Amer: >60 mL/min (ref >60)
GFR calc non Af Amer: >60 mL/min (ref >60)
Glucose, Bld: 118 mg/dL — ABNORMAL HIGH (ref 70–99)
Potassium: 3.6 mmol/L (ref 3.5–5.1)
Sodium: 137 mmol/L (ref 135–145)
Total Bilirubin: 0.7 mg/dL (ref 0.3–1.2)
Total Protein: 7.5 g/dL (ref 6.5–8.1)

## 2019-01-06 LAB — CBC WITH DIFFERENTIAL/PLATELET
Abs Immature Granulocytes: 0.08 10*3/uL — ABNORMAL HIGH (ref 0.00–0.07)
Basophils Absolute: 0.1 10*3/uL (ref 0.0–0.1)
Basophils Relative: 1 %
Eosinophils Absolute: 0.2 10*3/uL (ref 0.0–0.5)
Eosinophils Relative: 1 %
HCT: 46.6 % (ref 39.0–52.0)
Hemoglobin: 15.3 g/dL (ref 13.0–17.0)
Immature Granulocytes: 1 %
Lymphocytes Relative: 18 %
Lymphs Abs: 2.7 10*3/uL (ref 0.7–4.0)
MCH: 29.6 pg (ref 26.0–34.0)
MCHC: 32.8 g/dL (ref 30.0–36.0)
MCV: 90.1 fL (ref 80.0–100.0)
Monocytes Absolute: 1.3 10*3/uL — ABNORMAL HIGH (ref 0.1–1.0)
Monocytes Relative: 9 %
Neutro Abs: 10.5 10*3/uL — ABNORMAL HIGH (ref 1.7–7.7)
Neutrophils Relative %: 70 %
Platelets: 266 10*3/uL (ref 150–400)
RBC: 5.17 MIL/uL (ref 4.22–5.81)
RDW: 11.4 % — ABNORMAL LOW (ref 11.5–15.5)
WBC: 14.8 10*3/uL — ABNORMAL HIGH (ref 4.0–10.5)
nRBC: 0 % (ref 0.0–0.2)

## 2019-01-06 LAB — URINALYSIS, ROUTINE W REFLEX MICROSCOPIC
Bilirubin Urine: NEGATIVE
Glucose, UA: NEGATIVE mg/dL
Hgb urine dipstick: NEGATIVE
Ketones, ur: 5 mg/dL — AB
Leukocytes,Ua: NEGATIVE
Nitrite: NEGATIVE
Protein, ur: NEGATIVE mg/dL
Specific Gravity, Urine: >1.046 — ABNORMAL HIGH (ref 1.005–1.030)
pH: 9 — ABNORMAL HIGH (ref 5.0–8.0)

## 2019-01-06 LAB — RAPID URINE DRUG SCREEN, HOSP PERFORMED
Amphetamines: NOT DETECTED
Barbiturates: NOT DETECTED
Benzodiazepines: NOT DETECTED
Cocaine: POSITIVE — AB
Opiates: POSITIVE — AB
Tetrahydrocannabinol: POSITIVE — AB

## 2019-01-06 LAB — LIPASE, BLOOD: Lipase: 41 U/L (ref 11–51)

## 2019-01-06 MED ORDER — HALOPERIDOL LACTATE 5 MG/ML IJ SOLN
5.0000 mg | Freq: Once | INTRAMUSCULAR | Status: AC
  Administered 2019-01-06: 5 mg via INTRAVENOUS
  Filled 2019-01-06: qty 1

## 2019-01-06 MED ORDER — IOHEXOL 300 MG/ML  SOLN
100.0000 mL | Freq: Once | INTRAMUSCULAR | Status: AC | PRN
  Administered 2019-01-06: 18:00:00 100 mL via INTRAVENOUS

## 2019-01-06 MED ORDER — MORPHINE SULFATE (PF) 4 MG/ML IV SOLN
6.0000 mg | Freq: Once | INTRAVENOUS | Status: AC
  Administered 2019-01-06: 6 mg via INTRAVENOUS
  Filled 2019-01-06: qty 2

## 2019-01-06 MED ORDER — ONDANSETRON HCL 4 MG PO TABS: 4.0000 mg | ORAL_TABLET | Freq: Four times a day (QID) | ORAL | 0 refills | Status: DC

## 2019-01-06 MED ORDER — SODIUM CHLORIDE 0.9 % IV BOLUS
1000.0000 mL | Freq: Once | INTRAVENOUS | Status: AC
  Administered 2019-01-06: 1000 mL via INTRAVENOUS

## 2019-01-06 MED ORDER — ONDANSETRON HCL 4 MG/2ML IJ SOLN
4.0000 mg | Freq: Once | INTRAMUSCULAR | Status: AC
  Administered 2019-01-06: 4 mg via INTRAVENOUS
  Filled 2019-01-06: qty 2

## 2019-01-06 NOTE — ED Notes (Signed)
Patient is resting comfortably. 

## 2019-01-06 NOTE — ED Triage Notes (Signed)
C/o nausea and vomitting for one hour. Per [facility], no vomitting or dry heaving observed enroute.

## 2019-01-06 NOTE — ED Provider Notes (Signed)
[facility] EMERGENCY DEPARTMENT Provider Note   CSN: [id number] Arrival date & time: 01/06/19  1519    History   Chief Complaint Chief Complaint  Patient presents with  . Nausea  . Vomiting    HPI Eric Stout is a 33 y.o. male.     The history is provided by the patient and medical records. No language interpreter was used.     33 year old male brought here via EMS from home for evaluation of nausea and vomiting.  Patient reports since having lunch she has had persistent abdominal pain with associated nausea and vomiting.  Described on her pain as a crampy sensation to his mid abdomen that has been persistent.  Notes that he has been dry heaving and feeling very nauseous.  Symptoms moderate in severity.  Symptoms seem to be worsening with certain position change.  He denies any specific treatment tried.  No report of fever or chills, no URI symptoms, no recent sick contact, no constipation or diarrhea or dysuria.  He has had similar symptoms like this in the past.  He does admits to using marijuana, last use was today.  He denies any recent travel or eating exotic food.  No prior history of abdominal surgery.  Denies any recent alcohol use.  Past Medical History:  Diagnosis Date  . Asthma   . Bronchitis     There are no active problems to display for this patient.   No past surgical history on file.      Home Medications    Prior to Admission medications   Medication Sig Start Date End Date Taking? Authorizing Provider  benzonatate (TESSALON) 100 MG capsule Take 1 capsule (100 mg total) by mouth every 8 (eight) hours. 10/25/18   Carlyle Basques P, PA-C  cyclobenzaprine (FLEXERIL) 10 MG tablet Take 1 tablet (10 mg total) by mouth 3 (three) times daily as needed for muscle spasms. 07/10/18   Dione Booze, MD  meloxicam (MOBIC) 15 MG tablet Take 1 tablet (15 mg total) by mouth daily. 07/10/18   Dione Booze, MD  Phenylephrine-DM-GG-APAP 5-10-200-325 MG TABS  Take 2 tablets by mouth 3 (three) times daily. Patient not taking: Reported on 09/09/2016 02/27/15   Harle Battiest, NP    Family History No family history on file.  Social History Social History   Tobacco Use  . Smoking status: Current Every Day Smoker    Packs/day: 0.25  . Smokeless tobacco: Never Used  Substance Use Topics  . Alcohol use: No    Comment: once in a while.  . Drug use: Yes    Types: Marijuana     Allergies   Lactose intolerance (gi); Peanuts [peanut oil]; and Penicillins   Review of Systems Review of Systems  All other systems reviewed and are negative.    Physical Exam Updated Vital Signs BP (!) 129/108 (BP Location: Left Arm)   Pulse 68   Temp 98 F (36.7 C) (Oral)   Resp 18   SpO2 98%   Physical Exam Vitals signs and nursing note reviewed.  Constitutional:      Appearance: He is well-developed.     Comments: Patient appears uncomfortable but nontoxic.  HENT:     Head: Atraumatic.     Mouth/Throat:     Mouth: Mucous membranes are moist.  Eyes:     Conjunctiva/sclera: Conjunctivae normal.  Neck:     Musculoskeletal: Neck supple.  Cardiovascular:     Rate and Rhythm: Normal rate and regular rhythm.  Pulses: Normal pulses.     Heart sounds: Normal heart sounds.  Pulmonary:     Effort: Pulmonary effort is normal.     Breath sounds: Normal breath sounds.  Abdominal:     General: Abdomen is flat.     Palpations: Abdomen is soft.     Tenderness: There is abdominal tenderness (Not generalized abdominal chest tenderness without focal point tenderness.). There is no guarding or rebound.  Skin:    Findings: No rash.  Neurological:     Mental Status: He is alert. Mental status is at baseline.  Psychiatric:        Mood and Affect: Mood normal.      ED Treatments / Results  Labs (all labs ordered are listed, but only abnormal results are displayed) Labs Reviewed  CBC WITH DIFFERENTIAL/PLATELET - Abnormal; Notable for the  following components:      Result Value   WBC 14.8 (*)    RDW 11.4 (*)    Neutro Abs 10.5 (*)    Monocytes Absolute 1.3 (*)    Abs Immature Granulocytes 0.08 (*)    All other components within normal limits  COMPREHENSIVE METABOLIC PANEL - Abnormal; Notable for the following components:   Glucose, Bld 118 (*)    All other components within normal limits  URINALYSIS, ROUTINE W REFLEX MICROSCOPIC - Abnormal; Notable for the following components:   Specific Gravity, Urine >1.046 (*)    pH 9.0 (*)    Ketones, ur 5 (*)    All other components within normal limits  RAPID URINE DRUG SCREEN, HOSP PERFORMED - Abnormal; Notable for the following components:   Opiates POSITIVE (*)    Cocaine POSITIVE (*)    Tetrahydrocannabinol POSITIVE (*)    All other components within normal limits  LIPASE, BLOOD    EKG None  Radiology Ct Abdomen Pelvis W Contrast  Result Date: 01/06/2019 CLINICAL DATA:  Nausea and vomiting 1 hour. EXAM: CT ABDOMEN AND PELVIS WITH CONTRAST TECHNIQUE: Multidetector CT imaging of the abdomen and pelvis was performed using the standard protocol following bolus administration of intravenous contrast. CONTRAST:  OMNIPAQUE IOHEXOL 300 MG/ML  SOLN COMPARISON:  07/10/2018 FINDINGS: Lower chest: Normal. Hepatobiliary: Subcentimeter hypodensity over the right lobe of the liver too small to characterize but likely a cyst. Gallbladder and biliary tree are normal. Pancreas: Normal. Spleen: Normal. Adrenals/Urinary Tract: Adrenal glands are normal. Kidneys are normal in size without hydronephrosis or nephrolithiasis. Ureters and bladder are normal. Stomach/Bowel: Stomach and small bowel are normal. Appendix is normal. Colon is normal. Vascular/Lymphatic: Normal. Reproductive: Normal. Other: Tiny umbilical hernia containing only peritoneal fat. No free fluid or focal inflammatory change. Musculoskeletal: Unchanged. IMPRESSION: No acute findings in the abdomen/pelvis. Subcentimeter  hypodensity over the right lobe of the liver too small to characterize but likely a cyst. Tiny umbilical hernia containing only peritoneal fat. Electronically Signed   By: Elberta Fortis M.D.   On: 01/06/2019 17:57    Procedures Procedures (including critical care time)  Medications Ordered in ED Medications  haloperidol lactate (HALDOL) injection 5 mg (5 mg Intravenous Given 01/06/19 1552)  ondansetron (ZOFRAN) injection 4 mg (4 mg Intravenous Given 01/06/19 1549)  sodium chloride 0.9 % bolus 1,000 mL (0 mLs Intravenous Stopped 01/06/19 1704)  morphine 4 MG/ML injection 6 mg (6 mg Intravenous Given 01/06/19 1715)  iohexol (OMNIPAQUE) 300 MG/ML solution 100 mL (100 mLs Intravenous Contrast Given 01/06/19 1744)     Initial Impression / Assessment and Plan / ED Course  I have reviewed the triage vital signs and the nursing notes.  Pertinent labs & imaging results that were available during my care of the patient were reviewed by me and considered in my medical decision making (see chart for details).        BP (!) 147/76   Pulse 60   Temp 98 F (36.7 C) (Oral)   Resp 18   Ht  (1.905 m)   Wt 86.2 kg   SpO2 100%   BMI 23.75 kg/m    Final Clinical Impressions(s) / ED Diagnoses   Final diagnoses:  Cannabinoid hyperemesis syndrome [facility])    ED Discharge Orders         Ordered    ondansetron (ZOFRAN) 4 MG tablet  Every 6 hours     01/06/19 2024         3:42 PM Patient here with abdominal pain associate nausea and vomiting.  He appears uncomfortable but nontoxic.  Does have history of marijuana use which I suspect this could be related to cannabinoid hyperemesis.  No suspicion for biliary disease.  Work-up initiated, will provide symptomatic treatment.  His vital signs stable.  8:25 PM UDS positive for opiates, cocaine, and tetrahydrocannabinol.  The rest of his labs are reassuring.  Mildly elevated WBC of 14.8.  Abdomen and pelvis CT scan unremarkable.  After receiving  treatment, patient felt better and after scale. Encourage patient to avoid marijuana use as it can potentiate his symptoms.  Will discharge home with Zofran.   Fayrene Helper, PA-C 01/06/19 2026    Benjiman Core, MD 01/12/19 [phone]

## 2019-04-10 ENCOUNTER — Encounter (HOSPITAL_COMMUNITY): Payer: Self-pay | Admitting: *Deleted

## 2019-04-10 ENCOUNTER — Other Ambulatory Visit: Payer: Self-pay

## 2019-04-10 ENCOUNTER — Emergency Department (HOSPITAL_COMMUNITY)
Admission: EM | Admit: 2019-04-10 | Discharge: 2019-04-10 | Disposition: A | Discharge status: Home / Self Care | Payer: Self-pay | Attending: Emergency Medicine | Admitting: Emergency Medicine

## 2019-04-10 DIAGNOSIS — L03211 Cellulitis of face: Secondary | ICD-10-CM | POA: Insufficient documentation

## 2019-04-10 DIAGNOSIS — J45909 Unspecified asthma, uncomplicated: Secondary | ICD-10-CM | POA: Insufficient documentation

## 2019-04-10 DIAGNOSIS — R369 Urethral discharge, unspecified: Secondary | ICD-10-CM | POA: Insufficient documentation

## 2019-04-10 DIAGNOSIS — F17210 Nicotine dependence, cigarettes, uncomplicated: Secondary | ICD-10-CM | POA: Insufficient documentation

## 2019-04-10 LAB — HIV ANTIBODY (ROUTINE TESTING W REFLEX): HIV Screen 4th Generation wRfx: NONREACTIVE

## 2019-04-10 LAB — RPR: RPR Ser Ql: NONREACTIVE

## 2019-04-10 LAB — URINALYSIS, ROUTINE W REFLEX MICROSCOPIC
Bilirubin Urine: NEGATIVE
Glucose, UA: NEGATIVE mg/dL
Hgb urine dipstick: NEGATIVE
Ketones, ur: NEGATIVE mg/dL
Leukocytes,Ua: NEGATIVE
Nitrite: NEGATIVE
Protein, ur: NEGATIVE mg/dL
Specific Gravity, Urine: 1.014 (ref 1.005–1.030)
pH: 6 (ref 5.0–8.0)

## 2019-04-10 MED ORDER — CEPHALEXIN 500 MG PO CAPS: 500.0000 mg | ORAL_CAPSULE | Freq: Four times a day (QID) | ORAL | 0 refills | Status: DC

## 2019-04-10 MED ORDER — STERILE WATER FOR INJECTION IJ SOLN
INTRAMUSCULAR | Status: AC
  Filled 2019-04-10: qty 10

## 2019-04-10 MED ORDER — CEFTRIAXONE SODIUM 250 MG IJ SOLR
250.0000 mg | Freq: Once | INTRAMUSCULAR | Status: AC
  Administered 2019-04-10: 250 mg via INTRAMUSCULAR
  Filled 2019-04-10: qty 250

## 2019-04-10 MED ORDER — AZITHROMYCIN 250 MG PO TABS
1000.0000 mg | ORAL_TABLET | Freq: Once | ORAL | Status: AC
  Administered 2019-04-10: 1000 mg via ORAL
  Filled 2019-04-10: qty 4

## 2019-04-10 NOTE — ED Triage Notes (Signed)
C/o dental swelling and states his girlfriend called him and told him he needed to be checked for STD

## 2019-04-10 NOTE — ED Notes (Signed)
Patient verbalizes understanding of discharge instructions. Opportunity for questioning and answering were provided. Armband removed by staff , patient discharged from ED. 

## 2019-04-10 NOTE — ED Provider Notes (Signed)
[facility] Provider Note   CSN: [id number] Arrival date & time: 04/10/19  [id number]    History   Chief Complaint Chief Complaint  Patient presents with  . Dental Pain    HPI Eric Stout is a 33 y.o. male.     HPI   Eric Stout is a 33 y.o. male, with a history of asthma, presenting to the ED with two complaints.  First, he complains of pain and swelling to the left side of the face around the ear for the last couple days.  Pain is moderate, feels like a soreness, nonradiating. Denies dental pain, internal ear pain or drainage, difficulty swallowing or breathing, fever/chills.  Second, patient also complains of clear penile discharge with occasional dysuria for the past week.  He adds that his ex-girlfriend called him and told him he might have "chlamydia or syphilis" and he would need to get checked.   Denies abdominal pain, nausea/vomiting, testicular/scrotal pain/swelling, pain with bowel movements, or any other complaints.   Past Medical History:  Diagnosis Date  . Asthma   . Bronchitis     There are no active problems to display for this patient.   History reviewed. No pertinent surgical history.      Home Medications    Prior to Admission medications   Medication Sig Start Date End Date Taking? Authorizing Provider  benzonatate (TESSALON) 100 MG capsule Take 1 capsule (100 mg total) by mouth every 8 (eight) hours. 10/25/18   Carlyle BasquesHernandez, Ana P, PA-C  cephALEXin (KEFLEX) 500 MG capsule Take 1 capsule (500 mg total) by mouth 4 (four) times daily. 04/10/19   Krystian Ferrentino C, PA-C  cyclobenzaprine (FLEXERIL) 10 MG tablet Take 1 tablet (10 mg total) by mouth 3 (three) times daily as needed for muscle spasms. 07/10/18   Dione BoozeGlick, David, MD  meloxicam (MOBIC) 15 MG tablet Take 1 tablet (15 mg total) by mouth daily. 07/10/18   Dione BoozeGlick, David, MD  ondansetron (ZOFRAN) 4 MG tablet Take 1 tablet (4 mg total) by mouth every 6 (six) hours. 01/06/19    Fayrene Helperran, Bowie, PA-C  Phenylephrine-DM-GG-APAP 5-10-200-325 MG TABS Take 2 tablets by mouth 3 (three) times daily. Patient not taking: Reported on 09/09/2016 02/27/15   Harle Battiestysinger, Elizabeth, NP    Family History No family history on file.  Social History Social History   Tobacco Use  . Smoking status: Current Every Day Smoker    Packs/day: 0.25  . Smokeless tobacco: Never Used  Substance Use Topics  . Alcohol use: No    Comment: once in a while.  . Drug use: Yes    Types: Marijuana     Allergies   Lactose intolerance (gi), Peanuts [peanut oil], and Penicillins   Review of Systems Review of Systems  Constitutional: Negative for chills and fever.  HENT: Positive for facial swelling. Negative for dental problem, drooling, ear discharge, sore throat and trouble swallowing.   Gastrointestinal: Negative for nausea and vomiting.  Genitourinary: Positive for discharge and dysuria. Negative for flank pain, hematuria, scrotal swelling and testicular pain.  Musculoskeletal: Negative for back pain, neck pain and neck stiffness.  Neurological: Negative for dizziness, light-headedness and headaches.  All other systems reviewed and are negative.    Physical Exam Updated Vital Signs BP 124/74 (BP Location: Right Arm)   Pulse 77   Temp 98 F (36.7 C) (Oral)   Resp 16   Ht 5\' 11"  (1.803 m)   Wt 86.2 kg   SpO2 99%  BMI 26.50 kg/m   Physical Exam Vitals signs and nursing note reviewed.  Constitutional:      General: He is not in acute distress.    Appearance: He is well-developed. He is not diaphoretic.  HENT:     Head: Normocephalic and atraumatic.      Mouth/Throat:     Mouth: Mucous membranes are moist.     Pharynx: Oropharynx is clear. Uvula midline.     Comments: Dentition appears to be intact and stable.  No noted area of intraoral swelling or fluctuance.  No trismus or noted abnormal phonation.  Mouth opening to at least 3 finger widths.  Handles oral secretions without  difficulty. No sublingual swelling.  No swelling or tenderness to the submental region.  No swelling or tenderness into the soft tissues of the neck. Eyes:     Conjunctiva/sclera: Conjunctivae normal.  Neck:     Musculoskeletal: Neck supple.  Cardiovascular:     Rate and Rhythm: Normal rate and regular rhythm.     Pulses: Normal pulses.  Pulmonary:     Effort: Pulmonary effort is normal. No respiratory distress.  Abdominal:     Tenderness: There is no guarding.  Musculoskeletal:     Right lower leg: No edema.     Left lower leg: No edema.  Lymphadenopathy:     Cervical: Cervical adenopathy (left) present.     Comments: What appears to be singular, swollen, mobile, left cervical lymph node just inferior to the posterior mandible.  Skin:    General: Skin is warm and dry.  Neurological:     Mental Status: He is alert.  Psychiatric:        Mood and Affect: Mood and affect normal.        Speech: Speech normal.        Behavior: Behavior normal.      ED Treatments / Results  Labs (all labs ordered are listed, but only abnormal results are displayed) Labs Reviewed  URINE CULTURE  RPR  HIV ANTIBODY (ROUTINE TESTING W REFLEX)  URINALYSIS, ROUTINE W REFLEX MICROSCOPIC  GC/CHLAMYDIA PROBE AMP ([place]) NOT AT [facility]    EKG None  Radiology No results found.  Procedures Procedures (including critical care time)  Medications Ordered in ED Medications  cefTRIAXone (ROCEPHIN) injection 250 mg (250 mg Intramuscular Given 04/10/19 0851)  azithromycin (ZITHROMAX) tablet 1,000 mg (1,000 mg Oral Given 04/10/19 0850)     Initial Impression / Assessment and Plan / ED Course  I have reviewed the triage vital signs and the nursing notes.  Pertinent labs & imaging results that were available during my care of the patient were reviewed by me and considered in my medical decision making (see chart for details).        Patient presents with pain and swelling to the left earlobe and  a small section of his face around the posterior mandible.  Has the appearance of cellulitis.  We will treat with a course of antibiotics.  Due to the patient's dysuria and penile discharge in the setting of likely STD exposure, we will treat with ceftriaxone and azithromycin.  No abnormalities noted on UA.  Final Clinical Impressions(s) / ED Diagnoses   Final diagnoses:  Cellulitis of face    ED Discharge Orders         Ordered    cephALEXin (KEFLEX) 500 MG capsule  4 times daily     04/10/19 0907           Punam Broussard,  Lorella NimrodShawn C, PA-C 04/10/19 1648    Pricilla LovelessGoldston, Scott, MD 04/15/19 1614

## 2019-04-10 NOTE — Discharge Instructions (Addendum)
It appears as though he may have a cellulitis, or skin infection, to the left earlobe. Please take all of your antibiotics until finished!   You may develop abdominal discomfort or diarrhea from the antibiotic.  You may help offset this with probiotics which you can buy or get in yogurt. Do not eat or take the probiotics until 2 hours after your antibiotic.   Should symptoms fail to resolve after taking the antibiotic, please follow-up with the ear nose and throat specialist.  Call to make an appointment.  You have also been tested for STDs. Some of these results are still pending. Any abnormalities will be called to you. You have been empirically treated for gonorrhea and chlamydia. This does not mean you necessarily have these diseases, treatment is precautionary. Be sure to follow safe sex practices, including monogamy and/or condom use. All sexual partners must also be notified and treated.  Any future STD testing or treatment should be performed at the [place] or primary care office. For the treatment to be fully effective, avoid all sexual contact for at least 2 weeks after medication administration. Otherwise, you will infect your partner(s) and/or risk reinfecting yourself. Should symptoms fail to improve within 1 week, follow up with a primary care provider or go to the [facility].

## 2019-04-11 LAB — URINE CULTURE: Culture: NO GROWTH

## 2019-04-12 LAB — GC/CHLAMYDIA PROBE AMP (~~LOC~~) NOT AT ARMC
Chlamydia: NEGATIVE
Neisseria Gonorrhea: NEGATIVE

## 2019-09-03 ENCOUNTER — Other Ambulatory Visit: Payer: Self-pay

## 2019-09-03 DIAGNOSIS — G43009 Migraine without aura, not intractable, without status migrainosus: Secondary | ICD-10-CM | POA: Insufficient documentation

## 2019-09-03 DIAGNOSIS — F17200 Nicotine dependence, unspecified, uncomplicated: Secondary | ICD-10-CM | POA: Insufficient documentation

## 2019-09-03 DIAGNOSIS — Z91010 Allergy to peanuts: Secondary | ICD-10-CM | POA: Insufficient documentation

## 2019-09-03 DIAGNOSIS — Z791 Long term (current) use of non-steroidal anti-inflammatories (NSAID): Secondary | ICD-10-CM | POA: Insufficient documentation

## 2019-09-03 DIAGNOSIS — J45909 Unspecified asthma, uncomplicated: Secondary | ICD-10-CM | POA: Insufficient documentation

## 2019-09-04 ENCOUNTER — Other Ambulatory Visit: Payer: Self-pay

## 2019-09-04 ENCOUNTER — Emergency Department (HOSPITAL_COMMUNITY)
Admission: EM | Admit: 2019-09-04 | Discharge: 2019-09-04 | Disposition: A | Discharge status: Home / Self Care | Payer: Self-pay | Attending: Emergency Medicine | Admitting: Emergency Medicine

## 2019-09-04 DIAGNOSIS — G43009 Migraine without aura, not intractable, without status migrainosus: Secondary | ICD-10-CM

## 2019-09-04 MED ORDER — SODIUM CHLORIDE 0.9 % IV BOLUS: 1000.0000 mL | Freq: Once | INTRAVENOUS | Status: DC

## 2019-09-04 MED ORDER — DIPHENHYDRAMINE HCL 50 MG/ML IJ SOLN
12.5000 mg | Freq: Once | INTRAMUSCULAR | Status: DC
  Filled 2019-09-04: qty 1

## 2019-09-04 MED ORDER — PROCHLORPERAZINE EDISYLATE 10 MG/2ML IJ SOLN
5.0000 mg | Freq: Once | INTRAMUSCULAR | Status: DC
  Filled 2019-09-04: qty 2

## 2019-09-04 MED ORDER — ACETAMINOPHEN 500 MG PO TABS
1000.0000 mg | ORAL_TABLET | Freq: Once | ORAL | Status: AC
  Administered 2019-09-04: 08:00:00 1000 mg via ORAL
  Filled 2019-09-04: qty 2

## 2019-09-04 NOTE — ED Provider Notes (Signed)
[place] EMERGENCY DEPARTMENT Provider Note   CSN: [id number] Arrival date & time: 09/03/19  2352     History   Chief Complaint Chief Complaint  Patient presents with  . Headache    HPI Eric Stout is a 33 y.o. male with a past medical history of asthma, presents to ED with a chief complaint of headache. Woke up this morning with headache with associated photophobia and phonophobia. He reports history of similar headache in the past, which improved with "drinking a lot of water and taking pills they gave me here." He has tried ibuprofen with no improvement in his symptoms.  He was in his usual state of health when he went to sleep last night.  Denies any head injuries or falls, numbness in arms or legs, vision changes, vomiting, neck stiffness, shortness of breath.     HPI  Past Medical History:  Diagnosis Date  . Asthma   . Bronchitis     There are no active problems to display for this patient.   No past surgical history on file.      Home Medications    Prior to Admission medications   Medication Sig Start Date End Date Taking? Authorizing Provider  benzonatate (TESSALON) 100 MG capsule Take 1 capsule (100 mg total) by mouth every 8 (eight) hours. 10/25/18   Darlin Drop P, PA-C  cephALEXin (KEFLEX) 500 MG capsule Take 1 capsule (500 mg total) by mouth 4 (four) times daily. 04/10/19   Joy, Shawn C, PA-C  cyclobenzaprine (FLEXERIL) 10 MG tablet Take 1 tablet (10 mg total) by mouth 3 (three) times daily as needed for muscle spasms. 00/3/70   Delora Fuel, MD  meloxicam (MOBIC) 15 MG tablet Take 1 tablet (15 mg total) by mouth daily. 48/8/89   Delora Fuel, MD  ondansetron (ZOFRAN) 4 MG tablet Take 1 tablet (4 mg total) by mouth every 6 (six) hours. 01/06/19   Domenic Moras, PA-C  Phenylephrine-DM-GG-APAP 5-10-200-325 MG TABS Take 2 tablets by mouth 3 (three) times daily. Patient not taking: Reported on 09/09/2016 02/27/15   Britt Bottom, NP     Family History No family history on file.  Social History Social History   Tobacco Use  . Smoking status: Current Every Day Smoker    Packs/day: 0.25  . Smokeless tobacco: Never Used  Substance Use Topics  . Alcohol use: No    Comment: once in a while.  . Drug use: Yes    Types: Marijuana     Allergies   Lactose intolerance (gi), Peanuts [peanut oil], and Penicillins   Review of Systems Review of Systems  Constitutional: Negative for appetite change, chills and fever.  HENT: Negative for ear pain, rhinorrhea, sneezing and sore throat.   Eyes: Negative for photophobia and visual disturbance.  Respiratory: Negative for cough, chest tightness, shortness of breath and wheezing.   Cardiovascular: Negative for chest pain and palpitations.  Gastrointestinal: Negative for abdominal pain, blood in stool, constipation, diarrhea, nausea and vomiting.  Genitourinary: Negative for dysuria, hematuria and urgency.  Musculoskeletal: Negative for myalgias.  Skin: Negative for rash.  Neurological: Positive for headaches. Negative for dizziness, weakness and light-headedness.     Physical Exam Updated Vital Signs BP 103/65   Pulse (!) 51   Temp 97.7 F (36.5 C) (Oral)   Resp 18   SpO2 97%   Physical Exam Vitals signs and nursing note reviewed.  Constitutional:      General: He is not in acute distress.  Appearance: He is well-developed.  HENT:     Head: Normocephalic and atraumatic.     Nose: Nose normal.  Eyes:     General: No scleral icterus.       Right eye: No discharge.        Left eye: No discharge.     Conjunctiva/sclera: Conjunctivae normal.     Pupils: Pupils are equal, round, and reactive to light.  Neck:     Musculoskeletal: Normal range of motion and neck supple.  Cardiovascular:     Rate and Rhythm: Normal rate and regular rhythm.     Heart sounds: Normal heart sounds. No murmur. No friction rub. No gallop.   Pulmonary:     Effort: Pulmonary effort is  normal. No respiratory distress.     Breath sounds: Normal breath sounds.  Abdominal:     General: Bowel sounds are normal. There is no distension.     Palpations: Abdomen is soft.     Tenderness: There is no abdominal tenderness. There is no guarding.  Musculoskeletal: Normal range of motion.  Skin:    General: Skin is warm and dry.     Findings: No rash.  Neurological:     General: No focal deficit present.     Mental Status: He is alert and oriented to person, place, and time.     Cranial Nerves: No cranial nerve deficit.     Sensory: No sensory deficit.     Motor: No weakness or abnormal muscle tone.     Coordination: Coordination normal.      ED Treatments / Results  Labs (all labs ordered are listed, but only abnormal results are displayed) Labs Reviewed - No data to display  EKG None  Radiology No results found.  Procedures Procedures (including critical care time)  Medications Ordered in ED Medications  acetaminophen (TYLENOL) tablet 1,000 mg (1,000 mg Oral Given 09/04/19 [id number])     Initial Impression / Assessment and Plan / ED Course  I have reviewed the triage vital signs and the nursing notes.  Pertinent labs & imaging results that were available during my care of the patient were reviewed by me and considered in my medical decision making (see chart for details).        33 year old male presents to ED with a chief complaint of headache.  Headache appears consistent with a migraine which she reports history of.  States that his symptoms have improved with medications and hydration in the past.  No deficits to neurological exam noted. Symptoms improved with Tylenol. Suspect tension headache vs. Migraine. There are no headache characteristics that are lateralizing or concerning for increased ICP, infectious or vascular cause of his symptoms. Patient requesting discharge home.  Patient is hemodynamically stable, in NAD, and able to ambulate in the ED.  Evaluation does not show pathology that would require ongoing emergent intervention or inpatient treatment. I explained the diagnosis to the patient. Pain has been managed and has no complaints prior to discharge. Patient is comfortable with above plan and is stable for discharge at this time. All questions were answered prior to disposition. Strict return precautions for returning to the ED were discussed. Encouraged follow up with PCP.   An After Visit Summary was printed and given to the patient.   Portions of this note were generated with Scientist, clinical (histocompatibility and immunogenetics)Dragon dictation software. Dictation errors may occur despite best attempts at proofreading.   Final Clinical Impressions(s) / ED Diagnoses   Final diagnoses:  Migraine without aura and  without status migrainosus, not intractable    ED Discharge Orders    None       Dietrich Pates, PA-C 09/04/19 5956    Sabas Sous, MD 09/05/19 [phone]

## 2019-09-04 NOTE — Discharge Instructions (Addendum)
Continue your home medications as previously prescribed. Return to the ED if you start to have worsening symptoms, increase in headache, blurry vision, numbness in arms or legs, injuries or falls.

## 2019-09-04 NOTE — ED Triage Notes (Signed)
Pt presents with intermittent  10/10 HA since this am. Pt states he took 800mg  ibuprofen w/o relief. Pt endorses slight sensitivity to light. Pt playing games on his phone during triage.

## 2020-01-04 ENCOUNTER — Encounter (HOSPITAL_COMMUNITY): Payer: Self-pay | Admitting: Emergency Medicine

## 2020-01-04 ENCOUNTER — Emergency Department (HOSPITAL_COMMUNITY)
Admission: EM | Admit: 2020-01-04 | Discharge: 2020-01-05 | Disposition: A | Discharge status: Home / Self Care | Payer: Self-pay | Attending: Emergency Medicine | Admitting: Emergency Medicine

## 2020-01-04 DIAGNOSIS — Z79899 Other long term (current) drug therapy: Secondary | ICD-10-CM | POA: Insufficient documentation

## 2020-01-04 DIAGNOSIS — Z91010 Allergy to peanuts: Secondary | ICD-10-CM | POA: Insufficient documentation

## 2020-01-04 DIAGNOSIS — F17210 Nicotine dependence, cigarettes, uncomplicated: Secondary | ICD-10-CM | POA: Insufficient documentation

## 2020-01-04 DIAGNOSIS — R112 Nausea with vomiting, unspecified: Secondary | ICD-10-CM | POA: Insufficient documentation

## 2020-01-04 DIAGNOSIS — R197 Diarrhea, unspecified: Secondary | ICD-10-CM | POA: Insufficient documentation

## 2020-01-04 MED ORDER — ONDANSETRON 4 MG PO TBDP
4.0000 mg | ORAL_TABLET | Freq: Once | ORAL | Status: AC | PRN
  Administered 2020-01-04: 4 mg via ORAL
  Filled 2020-01-04: qty 1

## 2020-01-04 MED ORDER — SODIUM CHLORIDE 0.9% FLUSH: 3.0000 mL | Freq: Once | INTRAVENOUS | Status: DC

## 2020-01-04 NOTE — ED Triage Notes (Signed)
Pt reports N/V/D, abd pain since this AM. Diaphoretic in triage.

## 2020-01-05 LAB — COMPREHENSIVE METABOLIC PANEL
ALT: 42 U/L (ref 0–44)
AST: 26 U/L (ref 15–41)
Albumin: 4.3 g/dL (ref 3.5–5.0)
Alkaline Phosphatase: 84 U/L (ref 38–126)
Anion gap: 11 (ref 5–15)
BUN: 15 mg/dL (ref 6–20)
CO2: 24 mmol/L (ref 22–32)
Calcium: 9.3 mg/dL (ref 8.9–10.3)
Chloride: 103 mmol/L (ref 98–111)
Creatinine, Ser: 1.08 mg/dL (ref 0.61–1.24)
GFR calc Af Amer: >60 mL/min (ref >60)
GFR calc non Af Amer: >60 mL/min (ref >60)
Glucose, Bld: 120 mg/dL — ABNORMAL HIGH (ref 70–99)
Potassium: 3.6 mmol/L (ref 3.5–5.1)
Sodium: 138 mmol/L (ref 135–145)
Total Bilirubin: 0.8 mg/dL (ref 0.3–1.2)
Total Protein: 7.4 g/dL (ref 6.5–8.1)

## 2020-01-05 LAB — CBC
HCT: 44.4 % (ref 39.0–52.0)
Hemoglobin: 14.9 g/dL (ref 13.0–17.0)
MCH: 30.7 pg (ref 26.0–34.0)
MCHC: 33.6 g/dL (ref 30.0–36.0)
MCV: 91.5 fL (ref 80.0–100.0)
Platelets: 249 10*3/uL (ref 150–400)
RBC: 4.85 MIL/uL (ref 4.22–5.81)
RDW: 11.4 % — ABNORMAL LOW (ref 11.5–15.5)
WBC: 13.5 10*3/uL — ABNORMAL HIGH (ref 4.0–10.5)
nRBC: 0 % (ref 0.0–0.2)

## 2020-01-05 LAB — LIPASE, BLOOD: Lipase: 21 U/L (ref 11–51)

## 2020-01-05 MED ORDER — MORPHINE SULFATE (PF) 4 MG/ML IV SOLN
4.0000 mg | Freq: Once | INTRAVENOUS | Status: AC
  Administered 2020-01-05: 4 mg via INTRAVENOUS
  Filled 2020-01-05: qty 1

## 2020-01-05 MED ORDER — ONDANSETRON HCL 4 MG PO TABS: 4.0000 mg | ORAL_TABLET | Freq: Three times a day (TID) | ORAL | 0 refills | Status: AC | PRN

## 2020-01-05 MED ORDER — ONDANSETRON HCL 4 MG/2ML IJ SOLN
4.0000 mg | Freq: Once | INTRAMUSCULAR | Status: AC
  Administered 2020-01-05: 4 mg via INTRAVENOUS
  Filled 2020-01-05: qty 2

## 2020-01-05 MED ORDER — SODIUM CHLORIDE 0.9 % IV BOLUS
1000.0000 mL | Freq: Once | INTRAVENOUS | Status: AC
  Administered 2020-01-05: 03:00:00 1000 mL via INTRAVENOUS

## 2020-01-05 NOTE — Discharge Instructions (Addendum)
It is important to stay hydrated.  Your symptoms will likely last another 24-48 hours and then gradually improve.  If you have fever or worsening abdominal pain return for repeat evaluation.

## 2020-01-05 NOTE — ED Notes (Signed)
Upon entering triage room, pt spitting on to floor. Vomit also noted to be on floor. Pt has emesis bag in his hand... Informed pt he needs to use emesis bag to spit or throw up in and that it is disrespectful to be spitting on the floor when he has appropriate supplies within reach.

## 2020-01-05 NOTE — ED Provider Notes (Signed)
[facility] EMERGENCY DEPARTMENT Provider Note   CSN: [id number] Arrival date & time: 01/04/20  2334     History Chief Complaint  Patient presents with  . Emesis    Eric Stout is a 34 y.o. male.  Patient presents to the emergency department with a chief complaint of nausea, vomiting, diarrhea.  He states his symptoms started about 7 PM tonight.  He thinks that he ate bad bacon and got food poisoning.  He denies any fevers or chills.  He states that he has crampy abdominal pain.  Denies any successful treatments prior to arrival.  The history is provided by the patient. No language interpreter was used.       Past Medical History:  Diagnosis Date  . Asthma   . Bronchitis     There are no problems to display for this patient.   History reviewed. No pertinent surgical history.     No family history on file.  Social History   Tobacco Use  . Smoking status: Current Every Day Smoker    Packs/day: 0.25  . Smokeless tobacco: Never Used  Substance Use Topics  . Alcohol use: No    Comment: once in a while.  . Drug use: Yes    Types: Marijuana    Home Medications Prior to Admission medications   Medication Sig Start Date End Date Taking? Authorizing Provider  benzonatate (TESSALON) 100 MG capsule Take 1 capsule (100 mg total) by mouth every 8 (eight) hours. 10/25/18   Darlin Drop P, PA-C  cephALEXin (KEFLEX) 500 MG capsule Take 1 capsule (500 mg total) by mouth 4 (four) times daily. 04/10/19   Joy, Shawn C, PA-C  cyclobenzaprine (FLEXERIL) 10 MG tablet Take 1 tablet (10 mg total) by mouth 3 (three) times daily as needed for muscle spasms. 53/6/64   Delora Fuel, MD  meloxicam (MOBIC) 15 MG tablet Take 1 tablet (15 mg total) by mouth daily. 40/3/47   Delora Fuel, MD  ondansetron (ZOFRAN) 4 MG tablet Take 1 tablet (4 mg total) by mouth every 6 (six) hours. 01/06/19   Domenic Moras, PA-C  Phenylephrine-DM-GG-APAP 5-10-200-325 MG TABS Take 2 tablets by  mouth 3 (three) times daily. Patient not taking: Reported on 09/09/2016 02/27/15   Britt Bottom, NP    Allergies    Lactose intolerance (gi), Peanuts [peanut oil], and Penicillins  Review of Systems   Review of Systems  All other systems reviewed and are negative.   Physical Exam Updated Vital Signs BP (!) 147/103 (BP Location: Right Arm)   Pulse (!) 54   Temp 97.7 F (36.5 C) (Oral)   Resp 16   SpO2 95%   Physical Exam Vitals and nursing note reviewed.  Constitutional:      Appearance: He is well-developed.  HENT:     Head: Normocephalic and atraumatic.  Eyes:     Conjunctiva/sclera: Conjunctivae normal.  Cardiovascular:     Rate and Rhythm: Normal rate and regular rhythm.     Heart sounds: No murmur.  Pulmonary:     Effort: Pulmonary effort is normal. No respiratory distress.     Breath sounds: Normal breath sounds.  Abdominal:     Palpations: Abdomen is soft.     Tenderness: There is no abdominal tenderness.     Comments: No focal abdominal tenderness, there is diffuse discomfort  Musculoskeletal:        General: Normal range of motion.     Cervical back: Neck supple.  Skin:  General: Skin is warm and dry.  Neurological:     Mental Status: He is alert and oriented to person, place, and time.  Psychiatric:        Mood and Affect: Mood normal.        Behavior: Behavior normal.     ED Results / Procedures / Treatments   Labs (all labs ordered are listed, but only abnormal results are displayed) Labs Reviewed  COMPREHENSIVE METABOLIC PANEL - Abnormal; Notable for the following components:      Result Value   Glucose, Bld 120 (*)    All other components within normal limits  CBC - Abnormal; Notable for the following components:   WBC 13.5 (*)    RDW 11.4 (*)    All other components within normal limits  LIPASE, BLOOD  URINALYSIS, ROUTINE W REFLEX MICROSCOPIC    EKG None  Radiology No results found.  Procedures Procedures (including  critical care time)  Medications Ordered in ED Medications  sodium chloride flush (NS) 0.9 % injection 3 mL (has no administration in time range)  sodium chloride 0.9 % bolus 1,000 mL (has no administration in time range)  ondansetron (ZOFRAN) injection 4 mg (has no administration in time range)  morphine 4 MG/ML injection 4 mg (has no administration in time range)  ondansetron (ZOFRAN-ODT) disintegrating tablet 4 mg (4 mg Oral Given 01/04/20 2353)    ED Course  I have reviewed the triage vital signs and the nursing notes.  Pertinent labs & imaging results that were available during my care of the patient were reviewed by me and considered in my medical decision making (see chart for details).    MDM Rules/Calculators/A&P                      Patient here with sudden onset n/v/d that started tonight.  He states that he ate some bad bacon.  Will give fluids and antiemetics.  Abdomen is soft and non-tender, but he does have generalized abdominal discomfort/cramps.  Given fluids in ED.  Plan for discharge with zofran.  Return precautions given.  Final Clinical Impression(s) / ED Diagnoses Final diagnoses:  Nausea vomiting and diarrhea    Rx / DC Orders ED Discharge Orders         Ordered    ondansetron (ZOFRAN) 4 MG tablet  Every 8 hours PRN     01/05/20 0524           Roxy Horseman, PA-C 01/05/20 0527    Mesner, Barbara Cower, MD 01/05/20 [phone]

## 2020-04-03 IMAGING — CT CT ABDOMEN AND PELVIS WITH CONTRAST
2 of 4 series · 16 of 46 positions shown, 18 images · IV contrast (omnipaque)
Comparison: 07/10/2018

CLINICAL DATA: Nausea and vomiting 1 hour.

EXAM:
CT ABDOMEN AND PELVIS WITH CONTRAST
TECHNIQUE: Multidetector CT imaging of the abdomen and pelvis was performed
using the standard protocol following bolus administration of
intravenous contrast.
CONTRAST:  100mL OMNIPAQUE IOHEXOL 300 MG/ML  SOLN

[Series 3: abdomen 5.0 · axial · 0.76mm/px · z∈[+820,+1255]mm · 13 of 97 slices shown, 15 images]
[im 5/97  soft-tissue]
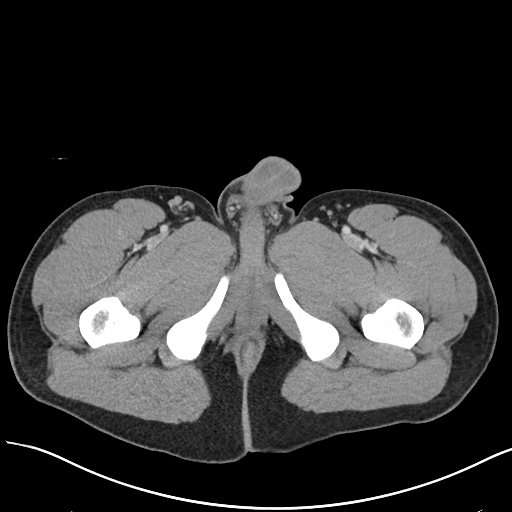
[im 5/97  bone]
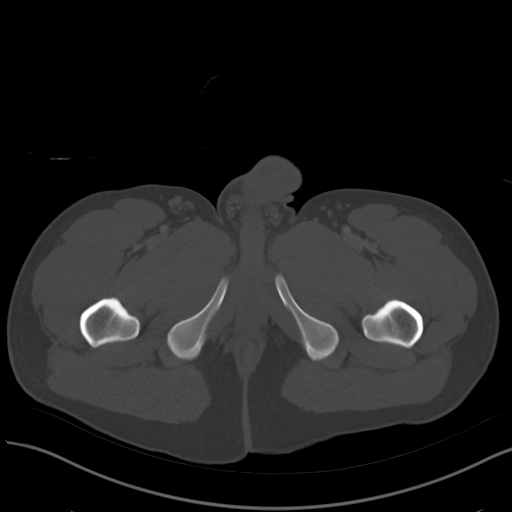
[im 14/97  soft-tissue]
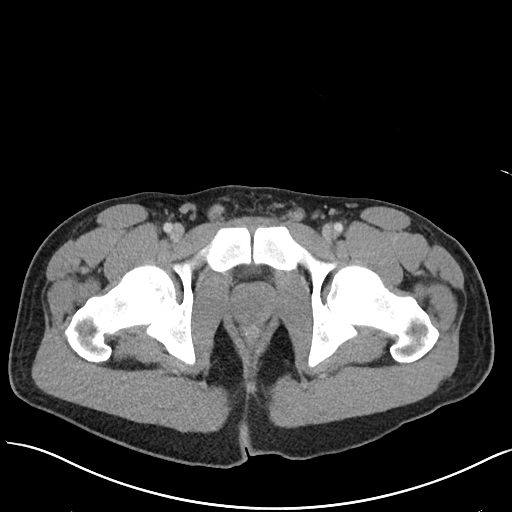
[im 19/97  soft-tissue]
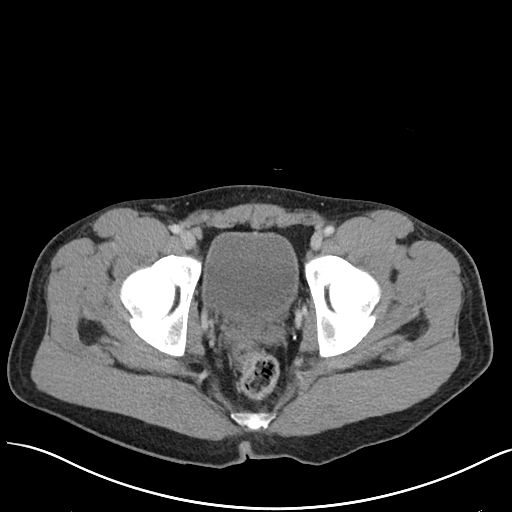
[im 28/97  soft-tissue]
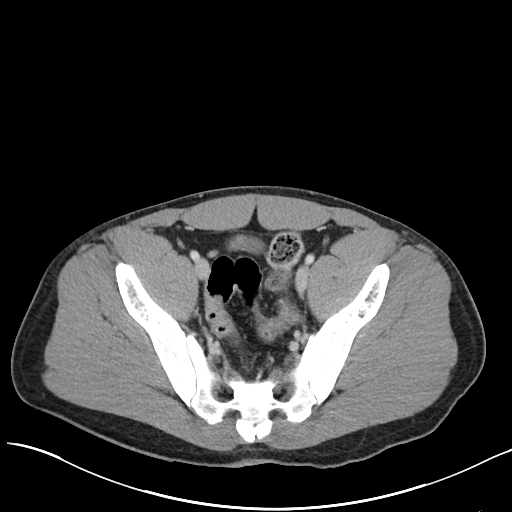
[im 33/97  soft-tissue]
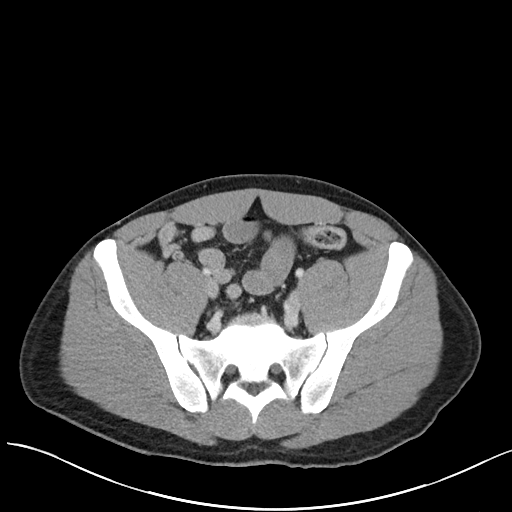
[im 42/97  soft-tissue]
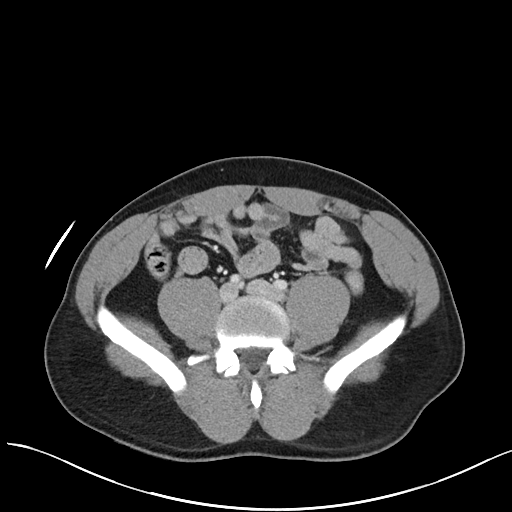
[im 51/97  soft-tissue]
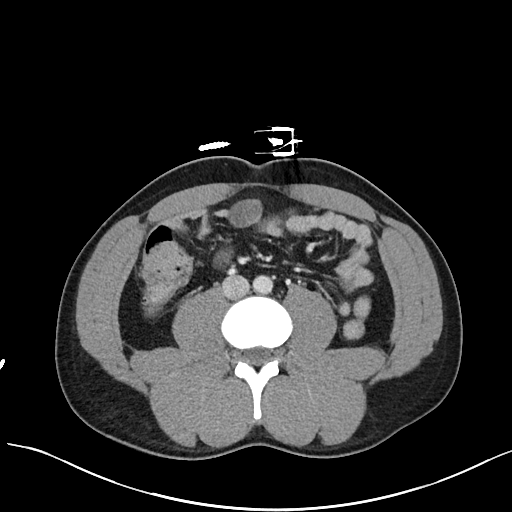
[im 55/97  soft-tissue]
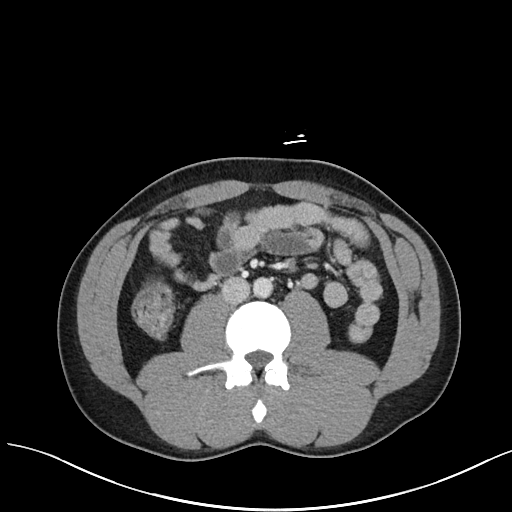
[im 65/97  soft-tissue]
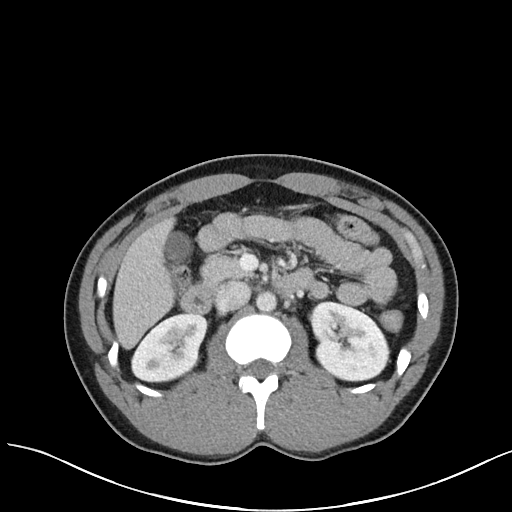
[im 65/97  bone]
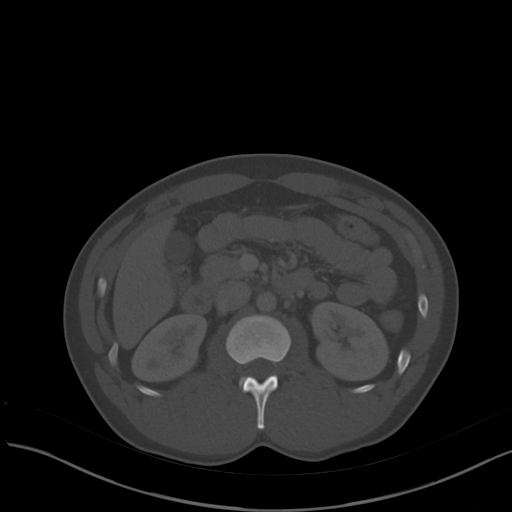
[im 69/97  soft-tissue]
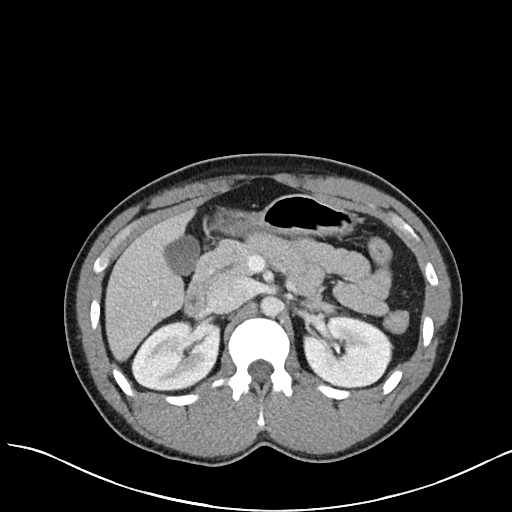
[im 78/97  soft-tissue]
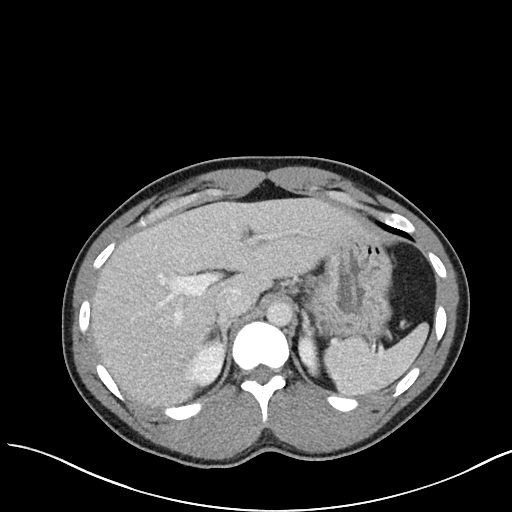
[im 83/97  soft-tissue]
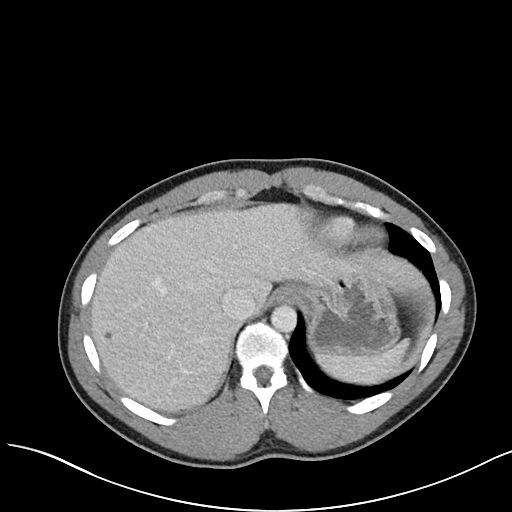
[im 92/97  soft-tissue]
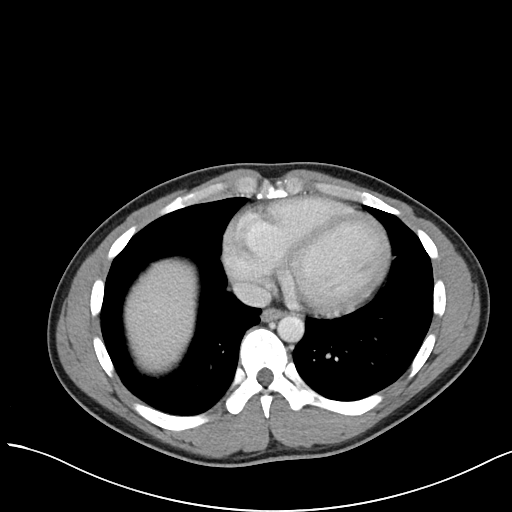

[Series 6: abdomen 3.0 mpr cor · coronal · 0.76mm/px · 3 of 94 slices shown]
[im 32/94  soft-tissue]
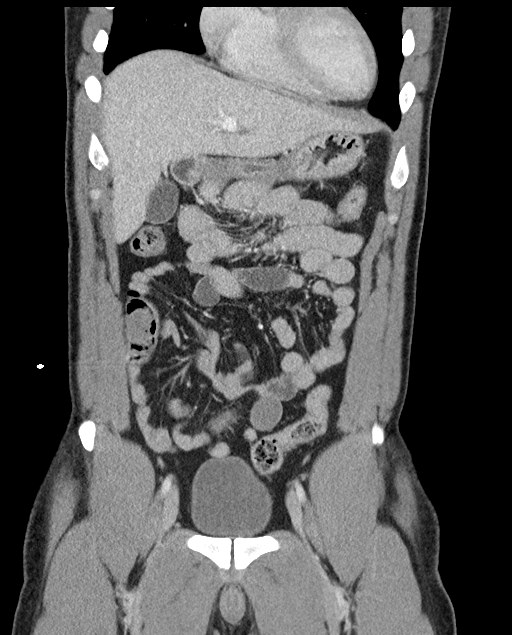
[im 42/94  soft-tissue]
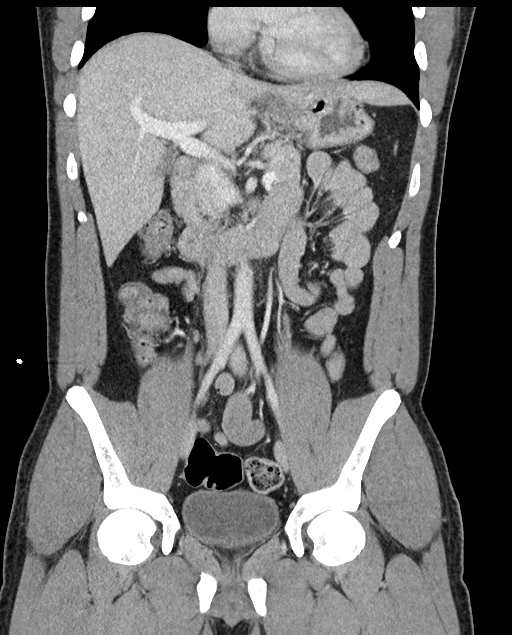
[im 52/94  soft-tissue]
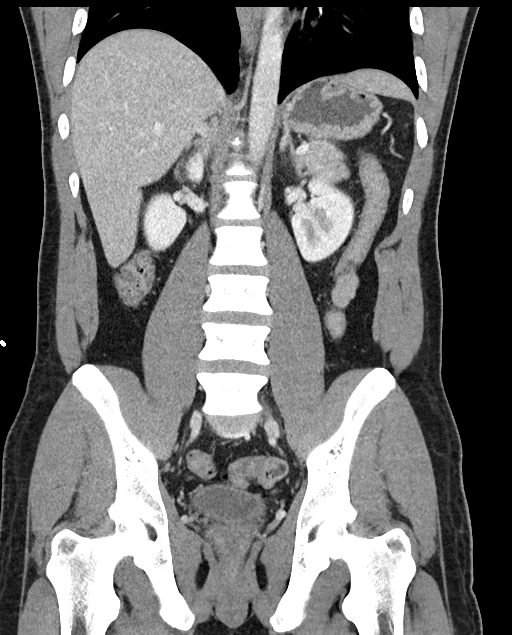

[16 of 46 positions shown; findings below may reference images not displayed]

FINDINGS: Lower chest: Normal.

Hepatobiliary: Subcentimeter hypodensity over the right lobe of the
liver too small to characterize but likely a cyst. Gallbladder and
biliary tree are normal.

Pancreas: Normal.

Spleen: Normal.

Adrenals/Urinary Tract: Adrenal glands are normal. Kidneys are
normal in size without hydronephrosis or nephrolithiasis. Ureters
and bladder are normal.

Stomach/Bowel: Stomach and small bowel are normal. Appendix is
normal. Colon is normal.

Vascular/Lymphatic: Normal.

Reproductive: Normal.

Other: Tiny umbilical hernia containing only peritoneal fat. No free
fluid or focal inflammatory change.

Musculoskeletal: Unchanged.
IMPRESSION: No acute findings in the abdomen/pelvis.

Subcentimeter hypodensity over the right lobe of the liver too small
to characterize but likely a cyst.

Tiny umbilical hernia containing only peritoneal fat.

## 2020-07-25 ENCOUNTER — Emergency Department (HOSPITAL_COMMUNITY)
Admission: EM | Admit: 2020-07-25 | Discharge: 2020-07-25 | Disposition: A | Discharge status: Home / Self Care | Payer: Self-pay | Attending: Emergency Medicine | Admitting: Emergency Medicine

## 2020-07-25 DIAGNOSIS — J45909 Unspecified asthma, uncomplicated: Secondary | ICD-10-CM | POA: Insufficient documentation

## 2020-07-25 DIAGNOSIS — Z91010 Allergy to peanuts: Secondary | ICD-10-CM | POA: Insufficient documentation

## 2020-07-25 DIAGNOSIS — N342 Other urethritis: Secondary | ICD-10-CM

## 2020-07-25 DIAGNOSIS — F17210 Nicotine dependence, cigarettes, uncomplicated: Secondary | ICD-10-CM | POA: Insufficient documentation

## 2020-07-25 DIAGNOSIS — N341 Nonspecific urethritis: Secondary | ICD-10-CM | POA: Insufficient documentation

## 2020-07-25 MED ORDER — STERILE WATER FOR INJECTION IJ SOLN
INTRAMUSCULAR | Status: AC
  Filled 2020-07-25: qty 10

## 2020-07-25 MED ORDER — CEFTRIAXONE SODIUM 500 MG IJ SOLR
250.0000 mg | Freq: Once | INTRAMUSCULAR | Status: AC
  Administered 2020-07-25: 250 mg via INTRAMUSCULAR
  Filled 2020-07-25: qty 500

## 2020-07-25 MED ORDER — AZITHROMYCIN 250 MG PO TABS
1000.0000 mg | ORAL_TABLET | Freq: Once | ORAL | Status: AC
  Administered 2020-07-25: 1000 mg via ORAL
  Filled 2020-07-25: qty 4

## 2020-07-25 NOTE — ED Triage Notes (Signed)
Patient here with significant other for STD check. Patient alert, oriented, and in no apparent distress.

## 2020-07-25 NOTE — ED Provider Notes (Signed)
[facility] EMERGENCY DEPARTMENT Provider Note   CSN: [id number] Arrival date & time: 07/25/20  1027     History Chief Complaint  Patient presents with  . SEXUALLY TRANSMITTED DISEASE    Eric Stout is a 34 y.o. male.  Pt is here with his significant other for a STD check.  The pt's girlfriend has some vaginal d/c.  He has no sx.        Past Medical History:  Diagnosis Date  . Asthma   . Bronchitis     There are no problems to display for this patient.   No past surgical history on file.     No family history on file.  Social History   Tobacco Use  . Smoking status: Current Every Day Smoker    Packs/day: 0.25  . Smokeless tobacco: Never Used  Substance Use Topics  . Alcohol use: No    Comment: once in a while.  . Drug use: Yes    Types: Marijuana    Home Medications Prior to Admission medications   Medication Sig Start Date End Date Taking? Authorizing Provider  benzonatate (TESSALON) 100 MG capsule Take 1 capsule (100 mg total) by mouth every 8 (eight) hours. Patient not taking: Reported on 01/05/2020 10/25/18   Carlyle Basques P, PA-C  cyclobenzaprine (FLEXERIL) 10 MG tablet Take 1 tablet (10 mg total) by mouth 3 (three) times daily as needed for muscle spasms. Patient not taking: Reported on 01/05/2020 07/10/18   Dione Booze, MD  meloxicam (MOBIC) 15 MG tablet Take 1 tablet (15 mg total) by mouth daily. Patient not taking: Reported on 01/05/2020 07/10/18   Dione Booze, MD  ondansetron (ZOFRAN) 4 MG tablet Take 1 tablet (4 mg total) by mouth every 8 (eight) hours as needed for nausea or vomiting. 01/05/20   Roxy Horseman, PA-C  Phenylephrine-DM-GG-APAP 5-10-200-325 MG TABS Take 2 tablets by mouth 3 (three) times daily. Patient not taking: Reported on 09/09/2016 02/27/15   Harle Battiest, NP    Allergies    Lactose intolerance (gi), Peanuts [peanut oil], and Penicillins  Review of Systems   Review of Systems  All other systems  reviewed and are negative.   Physical Exam Updated Vital Signs BP 135/79 (BP Location: Right Arm)   Pulse 87   Temp 98.1 F (36.7 C) (Oral)   Resp 16   SpO2 98%   Physical Exam Vitals and nursing note reviewed. Exam conducted with a chaperone present.  Constitutional:      Appearance: Normal appearance.  HENT:     Head: Normocephalic and atraumatic.     Right Ear: External ear normal.     Left Ear: External ear normal.     Nose: Nose normal.     Mouth/Throat:     Mouth: Mucous membranes are moist.     Pharynx: Oropharynx is clear.  Eyes:     Extraocular Movements: Extraocular movements intact.     Conjunctiva/sclera: Conjunctivae normal.     Pupils: Pupils are equal, round, and reactive to light.  Cardiovascular:     Rate and Rhythm: Normal rate and regular rhythm.     Pulses: Normal pulses.     Heart sounds: Normal heart sounds.  Pulmonary:     Effort: Pulmonary effort is normal.     Breath sounds: Normal breath sounds.  Abdominal:     General: Abdomen is flat. Bowel sounds are normal.     Palpations: Abdomen is soft.  Genitourinary:    Penis: Normal.  Testes: Normal.  Musculoskeletal:        General: Normal range of motion.     Cervical back: Normal range of motion and neck supple.  Skin:    General: Skin is warm.     Capillary Refill: Capillary refill takes less than 2 seconds.  Neurological:     General: No focal deficit present.     Mental Status: He is alert and oriented to person, place, and time.     ED Results / Procedures / Treatments   Labs (all labs ordered are listed, but only abnormal results are displayed) Labs Reviewed  GC/CHLAMYDIA PROBE AMP ([place]) NOT AT [facility]    EKG None  Radiology No results found.  Procedures Procedures (including critical care time)  Medications Ordered in ED Medications  sterile water (preservative free) injection (has no administration in time range)  cefTRIAXone (ROCEPHIN) injection 250 mg (250  mg Intramuscular Given 07/25/20 1038)  azithromycin (ZITHROMAX) tablet 1,000 mg (1,000 mg Oral Given 07/25/20 1037)    ED Course  I have reviewed the triage vital signs and the nursing notes.  Pertinent labs & imaging results that were available during my care of the patient were reviewed by me and considered in my medical decision making (see chart for details).    MDM Rules/Calculators/A&P                          Pt treated with rocephin and zithromax.  He is instructed to return if worse. Final Clinical Impression(s) / ED Diagnoses Final diagnoses:  Urethritis    Rx / DC Orders ED Discharge Orders    None       Jacalyn Lefevre, MD 07/25/20 1040

## 2020-07-26 LAB — GC/CHLAMYDIA PROBE AMP (~~LOC~~) NOT AT ARMC
Chlamydia: NEGATIVE
Comment: NEGATIVE
Comment: NORMAL
Neisseria Gonorrhea: NEGATIVE

## 2021-12-29 ENCOUNTER — Ambulatory Visit: Payer: BC Managed Care – PPO | Admitting: Family Medicine

## 2021-12-29 ENCOUNTER — Ambulatory Visit
Admission: RE | Admit: 2021-12-29 | Discharge: 2021-12-29 | Disposition: A | Discharge status: Home / Self Care | Payer: BC Managed Care – PPO | Source: Ambulatory Visit | Attending: Family Medicine | Admitting: Family Medicine

## 2021-12-29 ENCOUNTER — Encounter: Payer: Self-pay | Admitting: Family Medicine

## 2021-12-29 VITALS — BP 130/88 | HR 77 | Temp 97.4°F | Ht 71.0 in | Wt 189.6 lb

## 2021-12-29 DIAGNOSIS — R109 Unspecified abdominal pain: Secondary | ICD-10-CM

## 2021-12-29 DIAGNOSIS — M5442 Lumbago with sciatica, left side: Secondary | ICD-10-CM | POA: Diagnosis not present

## 2021-12-29 LAB — POCT URINALYSIS DIPSTICK
Bilirubin, UA: NEGATIVE
Blood, UA: NEGATIVE
Glucose, UA: NEGATIVE
Ketones, UA: NEGATIVE
Leukocytes, UA: NEGATIVE
Nitrite: NEGATIVE
Protein, UA: NEGATIVE
Spec Grav, UA: >=1.03 — AB (ref 1.010–1.025)
Urobilinogen, UA: 0.2 E.U./dL
pH, UA: 6 (ref 5.0–8.0)

## 2021-12-29 MED ORDER — KETOROLAC TROMETHAMINE 10 MG PO TABS: 10.0000 mg | ORAL_TABLET | Freq: Four times a day (QID) | ORAL | 0 refills | Status: AC | PRN

## 2021-12-29 MED ORDER — CYCLOBENZAPRINE HCL 10 MG PO TABS: 10.0000 mg | ORAL_TABLET | Freq: Three times a day (TID) | ORAL | 0 refills | Status: AC | PRN

## 2021-12-29 MED ORDER — KETOROLAC TROMETHAMINE 60 MG/2ML IM SOLN: 60.0000 mg | Freq: Once | INTRAMUSCULAR | Status: AC

## 2021-12-29 NOTE — Patient Instructions (Signed)
Acute Back Pain, Adult ?Acute back pain is sudden and usually short-lived. It is often caused by an injury to the muscles and tissues in the back. The injury may result from: ?A muscle, tendon, or ligament getting overstretched or torn. Ligaments are tissues that connect bones to each other. Lifting something improperly can cause a back strain. ?Wear and tear (degeneration) of the spinal disks. Spinal disks are circular tissue that provide cushioning between the bones of the spine (vertebrae). ?Twisting motions, such as while playing sports or doing yard work. ?A hit to the back. ?Arthritis. ?You may have a physical exam, lab tests, and imaging tests to find the cause of your pain. Acute back pain usually goes away with rest and home care. ?Follow these instructions at home: ?Managing pain, stiffness, and swelling ?Take over-the-counter and prescription medicines only as told by your health care provider. Treatment may include medicines for pain and inflammation that are taken by mouth or applied to the skin, or muscle relaxants. ?Your health care provider may recommend applying ice during the first 24-48 hours after your pain starts. To do this: ?Put ice in a plastic bag. ?Place a towel between your skin and the bag. ?Leave the ice on for 20 minutes, 2-3 times a day. ?Remove the ice if your skin turns bright red. This is very important. If you cannot feel pain, heat, or cold, you have a greater risk of damage to the area. ?If directed, apply heat to the affected area as often as told by your health care provider. Use the heat source that your health care provider recommends, such as a moist heat pack or a heating pad. ?Place a towel between your skin and the heat source. ?Leave the heat on for 20-30 minutes. ?Remove the heat if your skin turns bright red. This is especially important if you are unable to feel pain, heat, or cold. You have a greater risk of getting burned. ?Activity ? ?Do not stay in bed. Staying in  bed for more than 1-2 days can delay your recovery. ?Sit up and stand up straight. Avoid leaning forward when you sit or hunching over when you stand. ?If you work at a desk, sit close to it so you do not need to lean over. Keep your chin tucked in. Keep your neck drawn back, and keep your elbows bent at a 90-degree angle (right angle). ?Sit high and close to the steering wheel when you drive. Add lower back (lumbar) support to your car seat, if needed. ?Take short walks on even surfaces as soon as you are able. Try to increase the length of time you walk each day. ?Do not sit, drive, or stand in one place for more than 30 minutes at a time. Sitting or standing for long periods of time can put stress on your back. ?Do not drive or use heavy machinery while taking prescription pain medicine. ?Use proper lifting techniques. When you bend and lift, use positions that put less stress on your back: ?Bend your knees. ?Keep the load close to your body. ?Avoid twisting. ?Exercise regularly as told by your health care provider. Exercising helps your back heal faster and helps prevent back injuries by keeping muscles strong and flexible. ?Work with a physical therapist to make a safe exercise program, as recommended by your health care provider. Do any exercises as told by your physical therapist. ?Lifestyle ?Maintain a healthy weight. Extra weight puts stress on your back and makes it difficult to have good   posture. ?Avoid activities or situations that make you feel anxious or stressed. Stress and anxiety increase muscle tension and can make back pain worse. Learn ways to manage anxiety and stress, such as through exercise. ?General instructions ?Sleep on a firm mattress in a comfortable position. Try lying on your side with your knees slightly bent. If you lie on your back, put a pillow under your knees. ?Keep your head and neck in a straight line with your spine (neutral position) when using electronic equipment like  smartphones or pads. To do this: ?Raise your smartphone or pad to look at it instead of bending your head or neck to look down. ?Put the smartphone or pad at the level of your face while looking at the screen. ?Follow your treatment plan as told by your health care provider. This may include: ?Cognitive or behavioral therapy. ?Acupuncture or massage therapy. ?Meditation or yoga. ?Contact a health care provider if: ?You have pain that is not relieved with rest or medicine. ?You have increasing pain going down into your legs or buttocks. ?Your pain does not improve after 2 weeks. ?You have pain at night. ?You lose weight without trying. ?You have a fever or chills. ?You develop nausea or vomiting. ?You develop abdominal pain. ?Get help right away if: ?You develop new bowel or bladder control problems. ?You have unusual weakness or numbness in your arms or legs. ?You feel faint. ?These symptoms may represent a serious problem that is an emergency. Do not wait to see if the symptoms will go away. Get medical help right away. Call your local emergency services (911 in the [place] Do not drive yourself to the hospital. ?Summary ?Acute back pain is sudden and usually short-lived. ?Use proper lifting techniques. When you bend and lift, use positions that put less stress on your back. ?Take over-the-counter and prescription medicines only as told by your health care provider, and apply heat or ice as told. ?This information is not intended to replace advice given to you by your health care provider. Make sure you discuss any questions you have with your health care provider. ?Document Revised: 12/13/2020 Document Reviewed: 12/13/2020 ?Elsevier Patient Education ? 2022 [employer] ? ?Thank you for enrolling in MyChart. Please follow the instructions below to securely access your online medical record. MyChart allows you to send messages to your doctor, view your test results, manage appointments, and more. ? ? How Do I Sign  Up? ?In your Internet browser, go to [employer] and enter [email or web address]. ?Click on the Sign Up Now link in the Sign In box. You will see the New Member Sign Up page. ?Enter your MyChart Access Code exactly as it appears below. You will not need to use this code after you?ve completed the sign-up process. If you do not sign up before the expiration date, you must request a new code. ? ?MyChart Access Code: [email or web address] ?Expires: 02/12/2022 12:49 PM ? ?Enter your Social Security Number ([id number]) and Date of Birth (mm/dd/yyyy) as indicated and click Submit. You will be taken to the next sign-up page. ?Create a MyChart ID. This will be your MyChart login ID and cannot be changed, so think of one that is secure and easy to remember. ?Create a Clinical biochemist. You can change your password at any time. ?Enter your Password Reset Question and Answer. This can be used at a later time if you forget your password.  ?Enter your e-mail address. You will receive e-mail notification when new information is available in  MyChart. ?Click Sign Up. You can now view your medical record. ? ? Additional Information ?Remember, MyChart is NOT to be used for urgent needs. For medical emergencies, dial 911. ? ? ? ?

## 2021-12-29 NOTE — Progress Notes (Signed)
Subjective:  ? ? Eric Stout is a 36 y.o. male who presents for evaluation of low back pain. The patient has had recurrent self limited episodes of low back pain in the past. Symptoms have been present for several week and are gradually worsening.  Onset was related to / precipitated by  fall after sx har already started . The pain is located in the left lumbar area and radiating to left leg(s) and  flank . The pain is described as aching and occurs intermittently. He rates his pain as moderate. Symptoms are exacerbated by coughing, flexion, and twisting. Symptoms are improved by nothing. He has also tried acetaminophen which provided no symptom relief. He has  faslling if he bends over to far  associated with the back pain. The patient has no "red flag" history indicative of complicated back pain. ? ?The following portions of the patient's history were reviewed and updated as appropriate: allergies, current medications, past family history, past medical history, past social history, past surgical history, and problem list. ? ?Review of Systems ?Pertinent items noted in HPI and remainder of comprehensive ROS otherwise negative.  ?  ?Objective:  ? Full range of motion without pain, no tenderness, no spasm, no curvature. ?Normal reflexes, gait, strength and negative straight-leg raise. ?Inspection and palpation: inspection of back is normal, paraspinal tenderness noted on Left. ?Muscle tone and ROM exam: muscle spasm noted left back lumbar, full range of motion with pain. ?Neurological: normal DTRs, muscle strength and reflexes. ? No CVS TTP bil ?CTAB, nl wob ?RRR, No mrg ?Vitals reviewed ? ?Assessment:  ? ? Nonspecific acute low back pain ?Left Flank pain  ?  ?Plan:  ?  ?Stretching exercises discussed.  ? ?[name] was seen today for back pain. ? ?Diagnoses and all orders for this visit: ? ?Acute left flank pain ?-     POCT urinalysis dipstick ?-     ketorolac (TORADOL) injection 60 mg ?-     ketorolac (TORADOL) 10 MG  tablet; Take 1 tablet (10 mg total) by mouth every 6 (six) hours as needed. ? ?Acute left-sided low back pain with left-sided sciatica ?-     ketorolac (TORADOL) injection 60 mg ?-     ketorolac (TORADOL) 10 MG tablet; Take 1 tablet (10 mg total) by mouth every 6 (six) hours as needed. ?-     cyclobenzaprine (FLEXERIL) 10 MG tablet; Take 1 tablet (10 mg total) by mouth 3 (three) times daily as needed for muscle spasms. ?-     DG Lumbar Spine Complete; Future ? ? 1) no UTI, SG high, advised hydration ?2) started on meds for pain, warned against sedative nature of flexeril ? ?F/u in 2 weeks or sooner based on Xray, may need PT ?

## 2024-09-04 ENCOUNTER — Emergency Department: Payer: Self-pay

## 2024-09-04 ENCOUNTER — Emergency Department
Admission: EM | Admit: 2024-09-04 | Discharge: 2024-09-04 | Disposition: A | Discharge status: Home / Self Care | Payer: Self-pay | Attending: Emergency Medicine | Admitting: Emergency Medicine

## 2024-09-04 DIAGNOSIS — L03116 Cellulitis of left lower limb: Secondary | ICD-10-CM | POA: Insufficient documentation

## 2024-09-04 DIAGNOSIS — J45909 Unspecified asthma, uncomplicated: Secondary | ICD-10-CM | POA: Insufficient documentation

## 2024-09-04 DIAGNOSIS — B349 Viral infection, unspecified: Secondary | ICD-10-CM | POA: Insufficient documentation

## 2024-09-04 DIAGNOSIS — B49 Unspecified mycosis: Secondary | ICD-10-CM

## 2024-09-04 DIAGNOSIS — L0390 Cellulitis, unspecified: Secondary | ICD-10-CM

## 2024-09-04 MED ORDER — FLUCONAZOLE 150 MG PO TABS: 150.0000 mg | ORAL_TABLET | Freq: Every day | ORAL | 1 refills | Status: AC

## 2024-09-04 MED ORDER — DOXYCYCLINE HYCLATE 100 MG PO TABS: 100.0000 mg | ORAL_TABLET | Freq: Two times a day (BID) | ORAL | 0 refills | Status: AC

## 2024-09-04 NOTE — ED Provider Notes (Signed)
 [facility] Provider Note    Event Date/Time   First MD Initiated Contact with Patient 09/04/24 1830     (approximate)   History   Foot Pain    HPI  Eric Stout is a 38 y.o. male    with a past medical history of urethritis, migraine, cellulitis of face, cannabinoid hyperemesis, bronchitis inside, who presents to the ED complaining of left foot pain. According to the patient, symptoms started a week ago with desquamation of the skin of the left foot.  Patient endorses, he sweats a lot.  In the last 2 days patient reporting having itchiness, drainage, and pain in the plantar area.  Patient denies diabetes.  Patient denies fever, chills, chest pain, abdominal pain or urinary symptoms.  This is his first episode.  Patient endorses he has asthma and allergies.    There are no active problems to display for this patient.    Physical Exam   Triage Vital Signs: ED Triage Vitals  Encounter Vitals Group     BP 09/04/24 1701 (!) 153/85     Girls Systolic BP Percentile --      Girls Diastolic BP Percentile --      Boys Systolic BP Percentile --      Boys Diastolic BP Percentile --      Pulse Rate 09/04/24 1701 77     Resp 09/04/24 1701 18     Temp 09/04/24 1701 99.3 F (37.4 C)     Temp Source 09/04/24 1701 Oral     SpO2 09/04/24 1701 98 %     Weight 09/04/24 1702 200 lb (90.7 kg)     Height 09/04/24 1702 5' 11 (1.803 m)     Head Circumference --      Peak Flow --      Pain Score 09/04/24 1701 3     Pain Loc --      Pain Education --      Exclude from Growth Chart --     Most recent vital signs: Vitals:   09/04/24 1701  BP: (!) 153/85  Pulse: 77  Resp: 18  Temp: 99.3 F (37.4 C)  SpO2: 98%     Physical Exam Vitals and nursing note reviewed.  During triage patient was hypertensive  General:          Awake, no distress.  CV:                  Good peripheral perfusion. Regular rate and rhythm. Resp:               Normal effort. no  tachypnea.Equal breath sounds bilaterally.  No wheezing Abd:                 No distention.  Soft nontender Other:   Left foot: Skin in the dorsal area of the 2nd and 3rd 4th and 5th toes is erythematosus, desquamation of the skin, drainage of clear secretion.  Tenderness to palpation at the distal base of the first metatarsal   at the plantar side.  Skin in the medial side of foot is dry.  Full ROM of toes.  Sensation is intact.  Capillary refill less than 2 seconds.         ED Results / Procedures / Treatments   Labs (all labs ordered are listed, but only abnormal results are displayed) Labs Reviewed - No data to display     RADIOLOGY I independently reviewed and interpreted imaging and  agree with radiologists findings.      PROCEDURES:  Critical Care performed:   Procedures   MEDICATIONS ORDERED IN ED: Medications - No data to display Clinical Course as of 09/04/24 2010  Mon Sep 04, 2024  2006 DG Foot Complete Left Mild soft tissue swelling in the forefoot. No evidence of osteomyelitis. [AE]    Clinical Course User Index [AE] Janit Kast, PA-C    IMPRESSION / MDM / ASSESSMENT AND PLAN / ED COURSE  I reviewed the triage vital signs and the nursing notes.  Differential diagnosis includes, but is not limited to, fungal infection, cellulitis, osteomyelitis unlikely necrotizing fasciitis Patient's presentation is most consistent with acute complicated illness / injury requiring diagnostic workup.   Eric Stout is a 38 y.o., male who presents today with history of 1 week of left foot pain, itchiness, and drainage.  Patient endorses in the last 2 days drainage increased.  Patient denies history of diabetes.  Patient endorses hyperhidrosis on his feet.  Patient denies fever, chills, or other signs of infection.  On a physical exam patient was hypertensive during triage.  Left foot there is presence of a skin desquamation and erythema at the level of the 2nd, 3rd, 4th and  5th distal metatarsals.  Evidence of clear drainage.  Toes full ROM.  Sensation is intact.  Capillary refill less than 2 seconds.  Presence of dried skin in the medial side of the foot.  Rest of physical exam is normal. Plan Left foot x-ray Reassess the patient, left foot x-ray ruling out osteomyelitis.  Patient is going to be discharged doxycycline, fluconazole. Patient's diagnosis is consistent with cellulitis, fungal infection. I independently reviewed and interpreted imaging and agree with radiologists findings.  Did not order any labs.. I did review the patient's allergies and medications.The patient is in stable and satisfactory condition for discharge home  Patient will be discharged home with prescriptions for doxycycline, fluconazole. Patient is to follow up with PCP as needed or otherwise directed. Patient is given ED precautions to return to the ED for any worsening or new symptoms. Discussed plan of care with patient, answered all of patient's questions, patient agreeable to plan of care. Advised patient to take medications according to the instructions on the label. Discussed possible side effects of new medications. Patient verbalized understanding.  FINAL CLINICAL IMPRESSION(S) / ED DIAGNOSES   Final diagnoses:  Cellulitis, unspecified cellulitis site  Fungal infection     Rx / DC Orders   ED Discharge Orders          Ordered    doxycycline (VIBRA-TABS) 100 MG tablet  2 times daily        09/04/24 2010    fluconazole (DIFLUCAN) 150 MG tablet  Daily        09/04/24 2010             Note:  This document was prepared using Dragon voice recognition software and may include unintentional dictation errors.   Janit Kast, PA-C 09/04/24 2012    Dorothyann Drivers, MD 09/04/24 2103

## 2024-09-04 NOTE — ED Notes (Signed)
 See triage note. Significant peeling of skin and edema between 3rd, 4th and 5th digits. Pt states he has been putting numbing cream on affected area. Pt A&Ox4, NAD, fully ambulatory. Provider at pt bedside.

## 2024-09-04 NOTE — ED Triage Notes (Signed)
 Pt presents to the ED via POV from home with left foot pain x1 week. Pt's skin is peeling up. Denies fevers or chills. Pt reports full sensation being present.

## 2024-09-04 NOTE — Discharge Instructions (Addendum)
 You have been diagnosed with cellulitis, fungal infection of your left foot.  X-ray to rule out osteomyelitis or infection of the bone.  Please take doxycycline  1 tablet by mouth every 12 hours after main meals.  Please take fluconazole  1 tablet by mouth once a week.  Repeated doses next week.  Please make an appointment for a follow-up with your PCP.  Please come back to ED or go to your PCP if you have new symptoms or symptoms worsen.  Please avoid submerging your left foot in water .
